# Patient Record
Sex: Male | Born: 1940 | Race: White | Hispanic: No | Smoking: Former smoker
Health system: Southern US, Community
[De-identification: ages and names within clinical notes are randomized; demographics above are authoritative.]

## PROBLEM LIST (undated history)

## (undated) DIAGNOSIS — M109 Gout, unspecified: Secondary | ICD-10-CM

## (undated) DIAGNOSIS — E785 Hyperlipidemia, unspecified: Secondary | ICD-10-CM

## (undated) DIAGNOSIS — I10 Essential (primary) hypertension: Secondary | ICD-10-CM

## (undated) DIAGNOSIS — N529 Male erectile dysfunction, unspecified: Secondary | ICD-10-CM

## (undated) HISTORY — PX: OTHER SURGICAL HISTORY: SHX169

## (undated) HISTORY — DX: Hyperlipidemia, unspecified: E78.5

## (undated) HISTORY — DX: Essential (primary) hypertension: I10

## (undated) HISTORY — DX: Gout, unspecified: M10.9

## (undated) HISTORY — DX: Male erectile dysfunction, unspecified: N52.9

---

## 1999-04-10 ENCOUNTER — Encounter: Payer: Self-pay | Admitting: Orthopedic Surgery

## 1999-04-10 ENCOUNTER — Ambulatory Visit (HOSPITAL_COMMUNITY): Admission: RE | Admit: 1999-04-10 | Discharge: 1999-04-10 | Payer: Self-pay | Admitting: Orthopedic Surgery

## 2003-12-08 ENCOUNTER — Ambulatory Visit (HOSPITAL_COMMUNITY): Admission: RE | Admit: 2003-12-08 | Discharge: 2003-12-08 | Payer: Self-pay | Admitting: Gastroenterology

## 2004-12-01 ENCOUNTER — Ambulatory Visit (HOSPITAL_BASED_OUTPATIENT_CLINIC_OR_DEPARTMENT_OTHER): Admission: RE | Admit: 2004-12-01 | Discharge: 2004-12-01 | Payer: Self-pay | Admitting: Urology

## 2004-12-01 ENCOUNTER — Ambulatory Visit (HOSPITAL_COMMUNITY): Admission: RE | Admit: 2004-12-01 | Discharge: 2004-12-01 | Payer: Self-pay | Admitting: Urology

## 2010-10-19 ENCOUNTER — Other Ambulatory Visit (HOSPITAL_COMMUNITY): Payer: Self-pay | Admitting: Ophthalmology

## 2010-10-19 ENCOUNTER — Encounter (HOSPITAL_COMMUNITY)
Admission: RE | Admit: 2010-10-19 | Discharge: 2010-10-19 | Disposition: A | Discharge status: Home / Self Care | Payer: Medicare Other | Source: Ambulatory Visit | Attending: Ophthalmology | Admitting: Ophthalmology

## 2010-10-19 ENCOUNTER — Ambulatory Visit (HOSPITAL_COMMUNITY)
Admission: RE | Admit: 2010-10-19 | Discharge: 2010-10-19 | Disposition: A | Discharge status: Home / Self Care | Payer: Medicare Other | Source: Ambulatory Visit | Attending: Ophthalmology | Admitting: Ophthalmology

## 2010-10-19 DIAGNOSIS — H11009 Unspecified pterygium of unspecified eye: Secondary | ICD-10-CM | POA: Insufficient documentation

## 2010-10-19 DIAGNOSIS — Z01818 Encounter for other preprocedural examination: Secondary | ICD-10-CM | POA: Insufficient documentation

## 2010-10-19 DIAGNOSIS — H11001 Unspecified pterygium of right eye: Secondary | ICD-10-CM

## 2010-10-19 DIAGNOSIS — Z01810 Encounter for preprocedural cardiovascular examination: Secondary | ICD-10-CM | POA: Insufficient documentation

## 2010-10-19 DIAGNOSIS — Z01812 Encounter for preprocedural laboratory examination: Secondary | ICD-10-CM | POA: Insufficient documentation

## 2010-10-19 LAB — SURGICAL PCR SCREEN
MRSA, PCR: NEGATIVE
Staphylococcus aureus: NEGATIVE

## 2010-10-19 LAB — CBC
HCT: 41.8 % (ref 39.0–52.0)
Hemoglobin: 14.2 g/dL (ref 13.0–17.0)
MCH: 30.1 pg (ref 26.0–34.0)
MCHC: 34 g/dL (ref 30.0–36.0)
MCV: 88.6 fL (ref 78.0–100.0)
Platelets: 197 10*3/uL (ref 150–400)
RBC: 4.72 MIL/uL (ref 4.22–5.81)
RDW: 13 % (ref 11.5–15.5)
WBC: 7.5 10*3/uL (ref 4.0–10.5)

## 2010-10-19 LAB — BASIC METABOLIC PANEL
BUN: 24 mg/dL — ABNORMAL HIGH (ref 6–23)
CO2: 28 mEq/L (ref 19–32)
Calcium: 10.3 mg/dL (ref 8.4–10.5)
Chloride: 100 mEq/L (ref 96–112)
Creatinine, Ser: 0.89 mg/dL (ref 0.50–1.35)
GFR calc Af Amer: >60 mL/min (ref >60)
GFR calc non Af Amer: >60 mL/min (ref >60)
Glucose, Bld: 144 mg/dL — ABNORMAL HIGH (ref 70–99)
Potassium: 5.4 mEq/L — ABNORMAL HIGH (ref 3.5–5.1)
Sodium: 137 mEq/L (ref 135–145)

## 2010-10-31 ENCOUNTER — Ambulatory Visit (HOSPITAL_COMMUNITY)
Admission: RE | Admit: 2010-10-31 | Discharge: 2010-10-31 | Disposition: A | Discharge status: Home / Self Care | Payer: Medicare Other | Source: Ambulatory Visit | Attending: Ophthalmology | Admitting: Ophthalmology

## 2010-10-31 DIAGNOSIS — H11009 Unspecified pterygium of unspecified eye: Secondary | ICD-10-CM | POA: Insufficient documentation

## 2010-10-31 DIAGNOSIS — H179 Unspecified corneal scar and opacity: Secondary | ICD-10-CM | POA: Insufficient documentation

## 2010-10-31 DIAGNOSIS — J4489 Other specified chronic obstructive pulmonary disease: Secondary | ICD-10-CM | POA: Insufficient documentation

## 2010-10-31 DIAGNOSIS — K219 Gastro-esophageal reflux disease without esophagitis: Secondary | ICD-10-CM | POA: Insufficient documentation

## 2010-10-31 DIAGNOSIS — I10 Essential (primary) hypertension: Secondary | ICD-10-CM | POA: Insufficient documentation

## 2010-10-31 DIAGNOSIS — J449 Chronic obstructive pulmonary disease, unspecified: Secondary | ICD-10-CM | POA: Insufficient documentation

## 2010-10-31 DIAGNOSIS — Z01812 Encounter for preprocedural laboratory examination: Secondary | ICD-10-CM | POA: Insufficient documentation

## 2010-10-31 DIAGNOSIS — Z01810 Encounter for preprocedural cardiovascular examination: Secondary | ICD-10-CM | POA: Insufficient documentation

## 2010-10-31 DIAGNOSIS — Z01818 Encounter for other preprocedural examination: Secondary | ICD-10-CM | POA: Insufficient documentation

## 2013-05-05 ENCOUNTER — Other Ambulatory Visit: Payer: Self-pay | Admitting: Family Medicine

## 2013-05-05 ENCOUNTER — Ambulatory Visit
Admission: RE | Admit: 2013-05-05 | Discharge: 2013-05-05 | Disposition: A | Discharge status: Home / Self Care | Payer: Medicare Other | Source: Ambulatory Visit | Attending: Family Medicine | Admitting: Family Medicine

## 2013-05-05 DIAGNOSIS — R042 Hemoptysis: Secondary | ICD-10-CM

## 2013-05-05 DIAGNOSIS — R059 Cough, unspecified: Secondary | ICD-10-CM

## 2013-05-05 DIAGNOSIS — R05 Cough: Secondary | ICD-10-CM

## 2013-05-12 IMAGING — CR DG CHEST 2V
2 series · 2 of 2 positions shown · non-contrast
Comparison: None

CLINICAL DATA: Preop, surgeryeye surgery

CHEST - 2 VIEW

[view not recorded (1 of 2)]
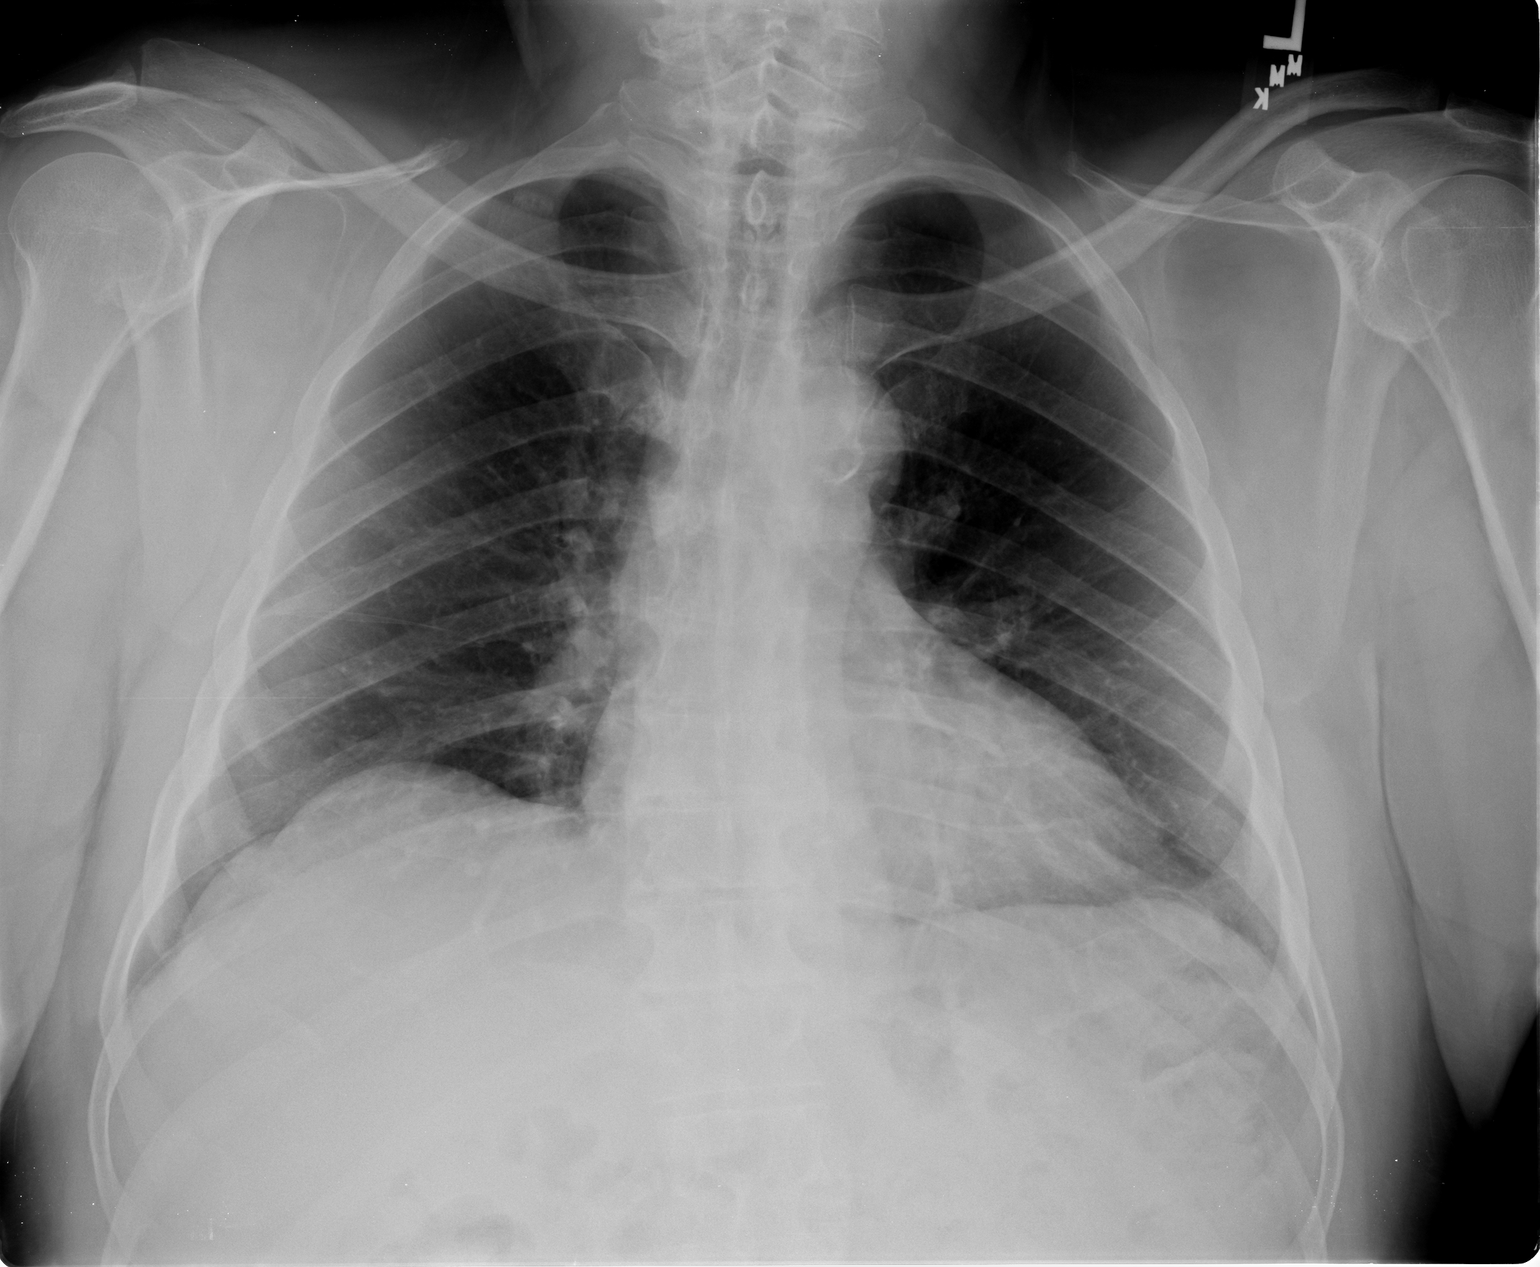

[view not recorded (2 of 2)]
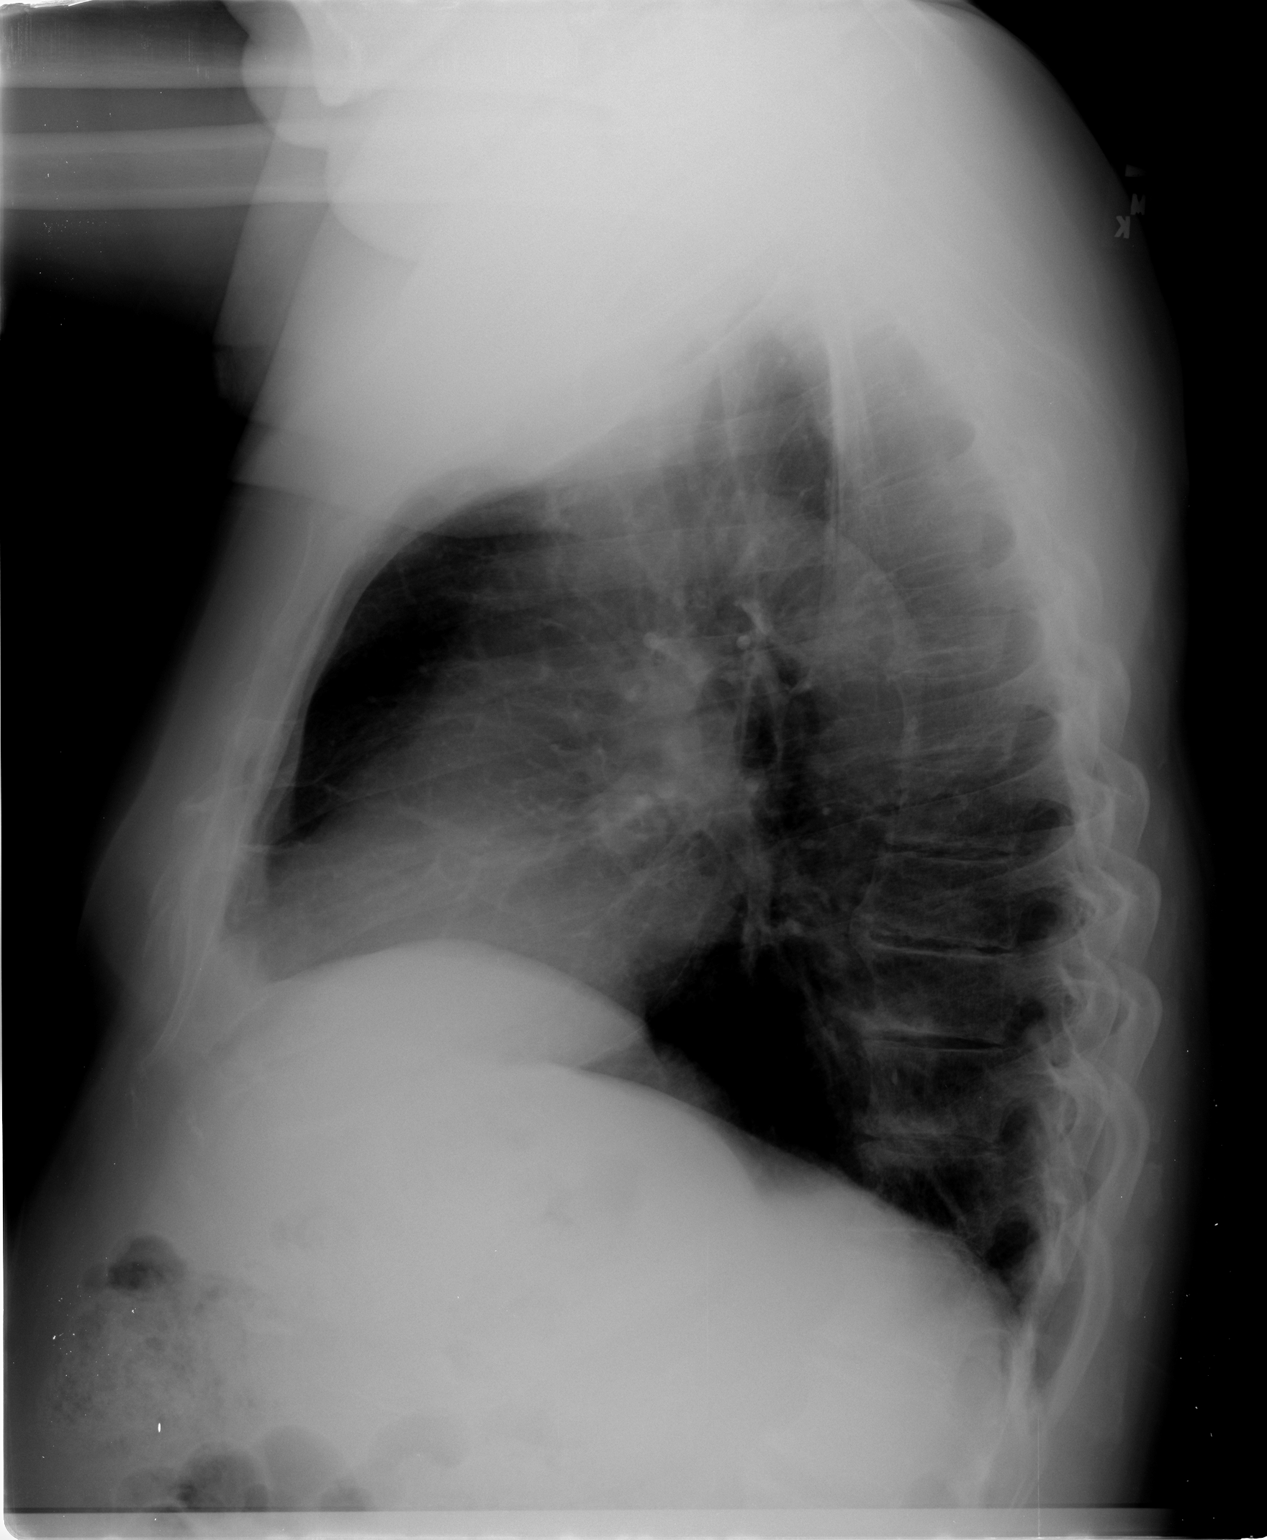

[2 of 2 positions shown; findings below may reference images not displayed]

FINDINGS: Normal mediastinum and heart silhouette.  Costophrenic
angles are clear.  No evidence effusion, infiltrate, or
pneumothorax.
IMPRESSION: No acute cardiopulmonary process.

## 2013-12-30 ENCOUNTER — Other Ambulatory Visit: Payer: Self-pay | Admitting: Gastroenterology

## 2014-05-07 ENCOUNTER — Encounter (HOSPITAL_COMMUNITY): Payer: Self-pay | Admitting: *Deleted

## 2014-05-18 ENCOUNTER — Ambulatory Visit (HOSPITAL_COMMUNITY)
Admission: RE | Admit: 2014-05-18 | Discharge: 2014-05-18 | Disposition: A | Discharge status: Home / Self Care | Payer: Medicare Other | Source: Ambulatory Visit | Attending: Gastroenterology | Admitting: Gastroenterology

## 2014-05-18 ENCOUNTER — Ambulatory Visit (HOSPITAL_COMMUNITY): Payer: Medicare Other | Admitting: Anesthesiology

## 2014-05-18 ENCOUNTER — Encounter (HOSPITAL_COMMUNITY): Payer: Self-pay | Admitting: *Deleted

## 2014-05-18 ENCOUNTER — Encounter (HOSPITAL_COMMUNITY)
Admission: RE | Disposition: A | Discharge status: Home / Self Care | Payer: Self-pay | Source: Ambulatory Visit | Attending: Gastroenterology

## 2014-05-18 DIAGNOSIS — M109 Gout, unspecified: Secondary | ICD-10-CM | POA: Insufficient documentation

## 2014-05-18 DIAGNOSIS — I10 Essential (primary) hypertension: Secondary | ICD-10-CM | POA: Insufficient documentation

## 2014-05-18 DIAGNOSIS — Z1211 Encounter for screening for malignant neoplasm of colon: Secondary | ICD-10-CM | POA: Insufficient documentation

## 2014-05-18 DIAGNOSIS — E780 Pure hypercholesterolemia: Secondary | ICD-10-CM | POA: Insufficient documentation

## 2014-05-18 DIAGNOSIS — E119 Type 2 diabetes mellitus without complications: Secondary | ICD-10-CM | POA: Insufficient documentation

## 2014-05-18 DIAGNOSIS — K5730 Diverticulosis of large intestine without perforation or abscess without bleeding: Secondary | ICD-10-CM | POA: Diagnosis not present

## 2014-05-18 HISTORY — PX: COLONOSCOPY WITH PROPOFOL: SHX5780

## 2014-05-18 SURGERY — COLONOSCOPY WITH PROPOFOL
Anesthesia: Monitor Anesthesia Care

## 2014-05-18 MED ORDER — LACTATED RINGERS IV SOLN
INTRAVENOUS | Status: DC
  Administered 2014-05-18: 10:00:00 via INTRAVENOUS

## 2014-05-18 MED ORDER — PROPOFOL 10 MG/ML IV BOLUS
INTRAVENOUS | Status: AC
Start: 2014-05-18 — End: 2014-05-18
  Filled 2014-05-18: qty 20

## 2014-05-18 MED ORDER — PROPOFOL 10 MG/ML IV BOLUS
INTRAVENOUS | Status: AC
  Filled 2014-05-18: qty 20

## 2014-05-18 MED ORDER — LIDOCAINE HCL (CARDIAC) 20 MG/ML IV SOLN
INTRAVENOUS | Status: AC
Start: 2014-05-18 — End: 2014-05-18
  Filled 2014-05-18: qty 5

## 2014-05-18 MED ORDER — SODIUM CHLORIDE 0.9 % IV SOLN: INTRAVENOUS | Status: DC

## 2014-05-18 MED ORDER — PROPOFOL INFUSION 10 MG/ML OPTIME
INTRAVENOUS | Status: DC | PRN
  Administered 2014-05-18: 160 ug/kg/min via INTRAVENOUS

## 2014-05-18 SURGICAL SUPPLY — 22 items

## 2014-05-18 NOTE — Anesthesia Postprocedure Evaluation (Signed)
  Anesthesia Post-op Note  Patient: Trevor Ortiz  Procedure(s) Performed: Procedure(s): COLONOSCOPY WITH PROPOFOL (N/A)  Patient Location: PACU  Anesthesia Type:MAC  Level of Consciousness: awake and alert   Airway and Oxygen Therapy: Patient Spontanous Breathing  Post-op Pain: none  Post-op Assessment: Post-op Vital signs reviewed  Post-op Vital Signs: Reviewed and stable  Last Vitals:  Filed Vitals:   05/18/14 1200  BP: 162/70  Pulse: 53  Temp:   Resp: 12    Complications: No apparent anesthesia complications

## 2014-05-18 NOTE — H&P (Signed)
  Procedure: Screening colonoscopy. Normal screening colonoscopy performed on 12/08/2003  History: The patient is a 74 year old male born 11-10-1940. He is scheduled to undergo a screening colonoscopy with polypectomy to prevent colon cancer today.  Medication allergies: Novocaine  Past medical history: Left cataract surgery. Testicular cyst surgery. Right hand surgery. Type 2 diabetes mellitus. Gout. Hypercholesterolemia. Hypertension.  Exam: The patient is alert and lying comfortably on the endoscopy stretcher. Abdomen is soft and nontender to palpation. Lungs are clear to auscultation. Cardiac exam reveals a regular rhythm.  Plan: Proceed with screening colonoscopy

## 2014-05-18 NOTE — Transfer of Care (Signed)
Immediate Anesthesia Transfer of Care Note  Patient: Trevor Ortiz  Procedure(s) Performed: Procedure(s): COLONOSCOPY WITH PROPOFOL (N/A)  Patient Location: PACU and Endoscopy Unit  Anesthesia Type:MAC  Level of Consciousness: awake, alert , oriented and patient cooperative  Airway & Oxygen Therapy: Patient Spontanous Breathing and Patient connected to face mask oxygen  Post-op Assessment: Report given to RN, Post -op Vital signs reviewed and stable and Patient moving all extremities  Post vital signs: Reviewed and stable  Last Vitals:  Filed Vitals:   05/18/14 0926  BP: 170/64  Pulse: 61  Temp: 36.9 C  Resp: 10    Complications: No apparent anesthesia complications

## 2014-05-18 NOTE — Discharge Instructions (Signed)
Colonoscopy, Care After °Refer to this sheet in the next few weeks. These instructions provide you with information on caring for yourself after your procedure. Your health care provider may also give you more specific instructions. Your treatment has been planned according to current medical practices, but problems sometimes occur. Call your health care provider if you have any problems or questions after your procedure. °WHAT TO EXPECT AFTER THE PROCEDURE  °After your procedure, it is typical to have the following: °· A small amount of blood in your stool. °· Moderate amounts of gas and mild abdominal cramping or bloating. ° ° °HOME CARE INSTRUCTIONS °· Do not drive, operate machinery, or sign important documents for 24 hours. °· You may shower and resume your regular physical activities, but move at a slower pace for the first 24 hours. °· Take frequent rest periods for the first 24 hours. °· Walk around or put a warm pack on your abdomen to help reduce abdominal cramping and bloating. °· Drink enough fluids to keep your urine clear or pale yellow. °· You may resume your normal diet as instructed by your health care provider. Avoid heavy or fried foods that are hard to digest. °· Avoid drinking alcohol for 24 hours or as instructed by your health care provider. °· Only take over-the-counter or prescription medicines as directed by your health care provider. °· If a tissue sample (biopsy) was taken during your procedure: °¨ Do not take aspirin or blood thinners for 7 days, or as instructed by your health care provider. °¨ Do not drink alcohol for 7 days, or as instructed by your health care provider. °¨ Eat soft foods for the first 24 hours. ° ° °SEEK MEDICAL CARE IF: °You have persistent spotting of blood in your stool 2-3 days after the procedure. ° ° °SEEK IMMEDIATE MEDICAL CARE IF: °· You have more than a small spotting of blood in your stool. °· You pass large blood clots in your stool. °· Your abdomen is  swollen (distended). °· You have nausea or vomiting. °· You have a fever. °· You have increasing abdominal pain that is not relieved with medicine. ° ° °

## 2014-05-18 NOTE — Anesthesia Preprocedure Evaluation (Addendum)
Anesthesia Evaluation  Patient identified by MRN, date of birth, ID band Patient awake    Reviewed: Allergy & Precautions, NPO status , Patient's Chart, lab work & pertinent test results  Airway Mallampati: I  TM Distance: >3 FB Neck ROM: Full    Dental  (+) Teeth Intact   Pulmonary former smoker,  breath sounds clear to auscultation        Cardiovascular hypertension, Pt. on medications Rhythm:Regular Rate:Normal     Neuro/Psych negative neurological ROS     GI/Hepatic Neg liver ROS, GERD-  Medicated,  Endo/Other  negative endocrine ROS  Renal/GU negative Renal ROS     Musculoskeletal negative musculoskeletal ROS (+)   Abdominal   Peds  Hematology negative hematology ROS (+)   Anesthesia Other Findings   Reproductive/Obstetrics                           Anesthesia Physical Anesthesia Plan  ASA: II  Anesthesia Plan: MAC   Post-op Pain Management:    Induction: Intravenous  Airway Management Planned: Simple Face Mask and Natural Airway  Additional Equipment:   Intra-op Plan:   Post-operative Plan:   Informed Consent: I have reviewed the patients History and Physical, chart, labs and discussed the procedure including the risks, benefits and alternatives for the proposed anesthesia with the patient or authorized representative who has indicated his/her understanding and acceptance.     Plan Discussed with: CRNA  Anesthesia Plan Comments:         Anesthesia Quick Evaluation

## 2014-05-18 NOTE — Op Note (Signed)
Procedure: Screening colonoscopy. Normal screening colonoscopy performed on 12/08/2003  Endoscopist: Earle Gell  Premedication: Propofol administered by anesthesia  Procedure: The patient was placed in the left lateral decubitus position. Anal inspection and digital rectal exam were normal. The Pentax pediatric colonoscope was introduced into the rectum and advanced to the cecum. A normal-appearing appendiceal orifice was identified. A normal-appearing ileocecal valve was identified. Colonic preparation for the exam today was good. Withdrawal time was 10 minutes  Rectum. Normal. Retroflexed view of the distal rectum normal  Sigmoid colon and descending colon. Left colonic diverticulosis  Splenic flexure. Normal  Transverse colon. Normal  Hepatic flexure. Normal  Ascending colon. Normal  Cecum and ileocecal valve. Normal  Assessment: Normal screening colonoscopy

## 2014-05-19 ENCOUNTER — Encounter (HOSPITAL_COMMUNITY): Payer: Self-pay | Admitting: Gastroenterology

## 2015-06-15 DIAGNOSIS — C44319 Basal cell carcinoma of skin of other parts of face: Secondary | ICD-10-CM | POA: Diagnosis not present

## 2015-06-15 DIAGNOSIS — Z1283 Encounter for screening for malignant neoplasm of skin: Secondary | ICD-10-CM | POA: Diagnosis not present

## 2015-06-15 DIAGNOSIS — D225 Melanocytic nevi of trunk: Secondary | ICD-10-CM | POA: Diagnosis not present

## 2015-06-28 DIAGNOSIS — T148 Other injury of unspecified body region: Secondary | ICD-10-CM | POA: Diagnosis not present

## 2015-07-15 DIAGNOSIS — L570 Actinic keratosis: Secondary | ICD-10-CM | POA: Diagnosis not present

## 2015-07-15 DIAGNOSIS — X32XXXD Exposure to sunlight, subsequent encounter: Secondary | ICD-10-CM | POA: Diagnosis not present

## 2015-08-05 DIAGNOSIS — X32XXXD Exposure to sunlight, subsequent encounter: Secondary | ICD-10-CM | POA: Diagnosis not present

## 2015-08-05 DIAGNOSIS — C44319 Basal cell carcinoma of skin of other parts of face: Secondary | ICD-10-CM | POA: Diagnosis not present

## 2015-08-05 DIAGNOSIS — L570 Actinic keratosis: Secondary | ICD-10-CM | POA: Diagnosis not present

## 2015-08-11 DIAGNOSIS — J309 Allergic rhinitis, unspecified: Secondary | ICD-10-CM | POA: Diagnosis not present

## 2015-08-11 DIAGNOSIS — Z0000 Encounter for general adult medical examination without abnormal findings: Secondary | ICD-10-CM | POA: Diagnosis not present

## 2015-08-11 DIAGNOSIS — Z125 Encounter for screening for malignant neoplasm of prostate: Secondary | ICD-10-CM | POA: Diagnosis not present

## 2015-08-11 DIAGNOSIS — M150 Primary generalized (osteo)arthritis: Secondary | ICD-10-CM | POA: Diagnosis not present

## 2015-08-11 DIAGNOSIS — M109 Gout, unspecified: Secondary | ICD-10-CM | POA: Diagnosis not present

## 2015-08-11 DIAGNOSIS — E782 Mixed hyperlipidemia: Secondary | ICD-10-CM | POA: Diagnosis not present

## 2015-08-11 DIAGNOSIS — N400 Enlarged prostate without lower urinary tract symptoms: Secondary | ICD-10-CM | POA: Diagnosis not present

## 2015-08-11 DIAGNOSIS — E119 Type 2 diabetes mellitus without complications: Secondary | ICD-10-CM | POA: Diagnosis not present

## 2015-08-11 DIAGNOSIS — I10 Essential (primary) hypertension: Secondary | ICD-10-CM | POA: Diagnosis not present

## 2015-08-11 DIAGNOSIS — K219 Gastro-esophageal reflux disease without esophagitis: Secondary | ICD-10-CM | POA: Diagnosis not present

## 2015-08-11 DIAGNOSIS — N528 Other male erectile dysfunction: Secondary | ICD-10-CM | POA: Diagnosis not present

## 2015-09-06 DIAGNOSIS — H11002 Unspecified pterygium of left eye: Secondary | ICD-10-CM | POA: Diagnosis not present

## 2015-09-28 DIAGNOSIS — S91332A Puncture wound without foreign body, left foot, initial encounter: Secondary | ICD-10-CM | POA: Diagnosis not present

## 2015-11-27 IMAGING — CR DG CHEST 2V
2 series · 2 of 2 positions shown · non-contrast
Comparison: 10/19/2010

CLINICAL DATA: Cough, hemoptysis

EXAM:
CHEST  2 VIEW

[w chest pa]
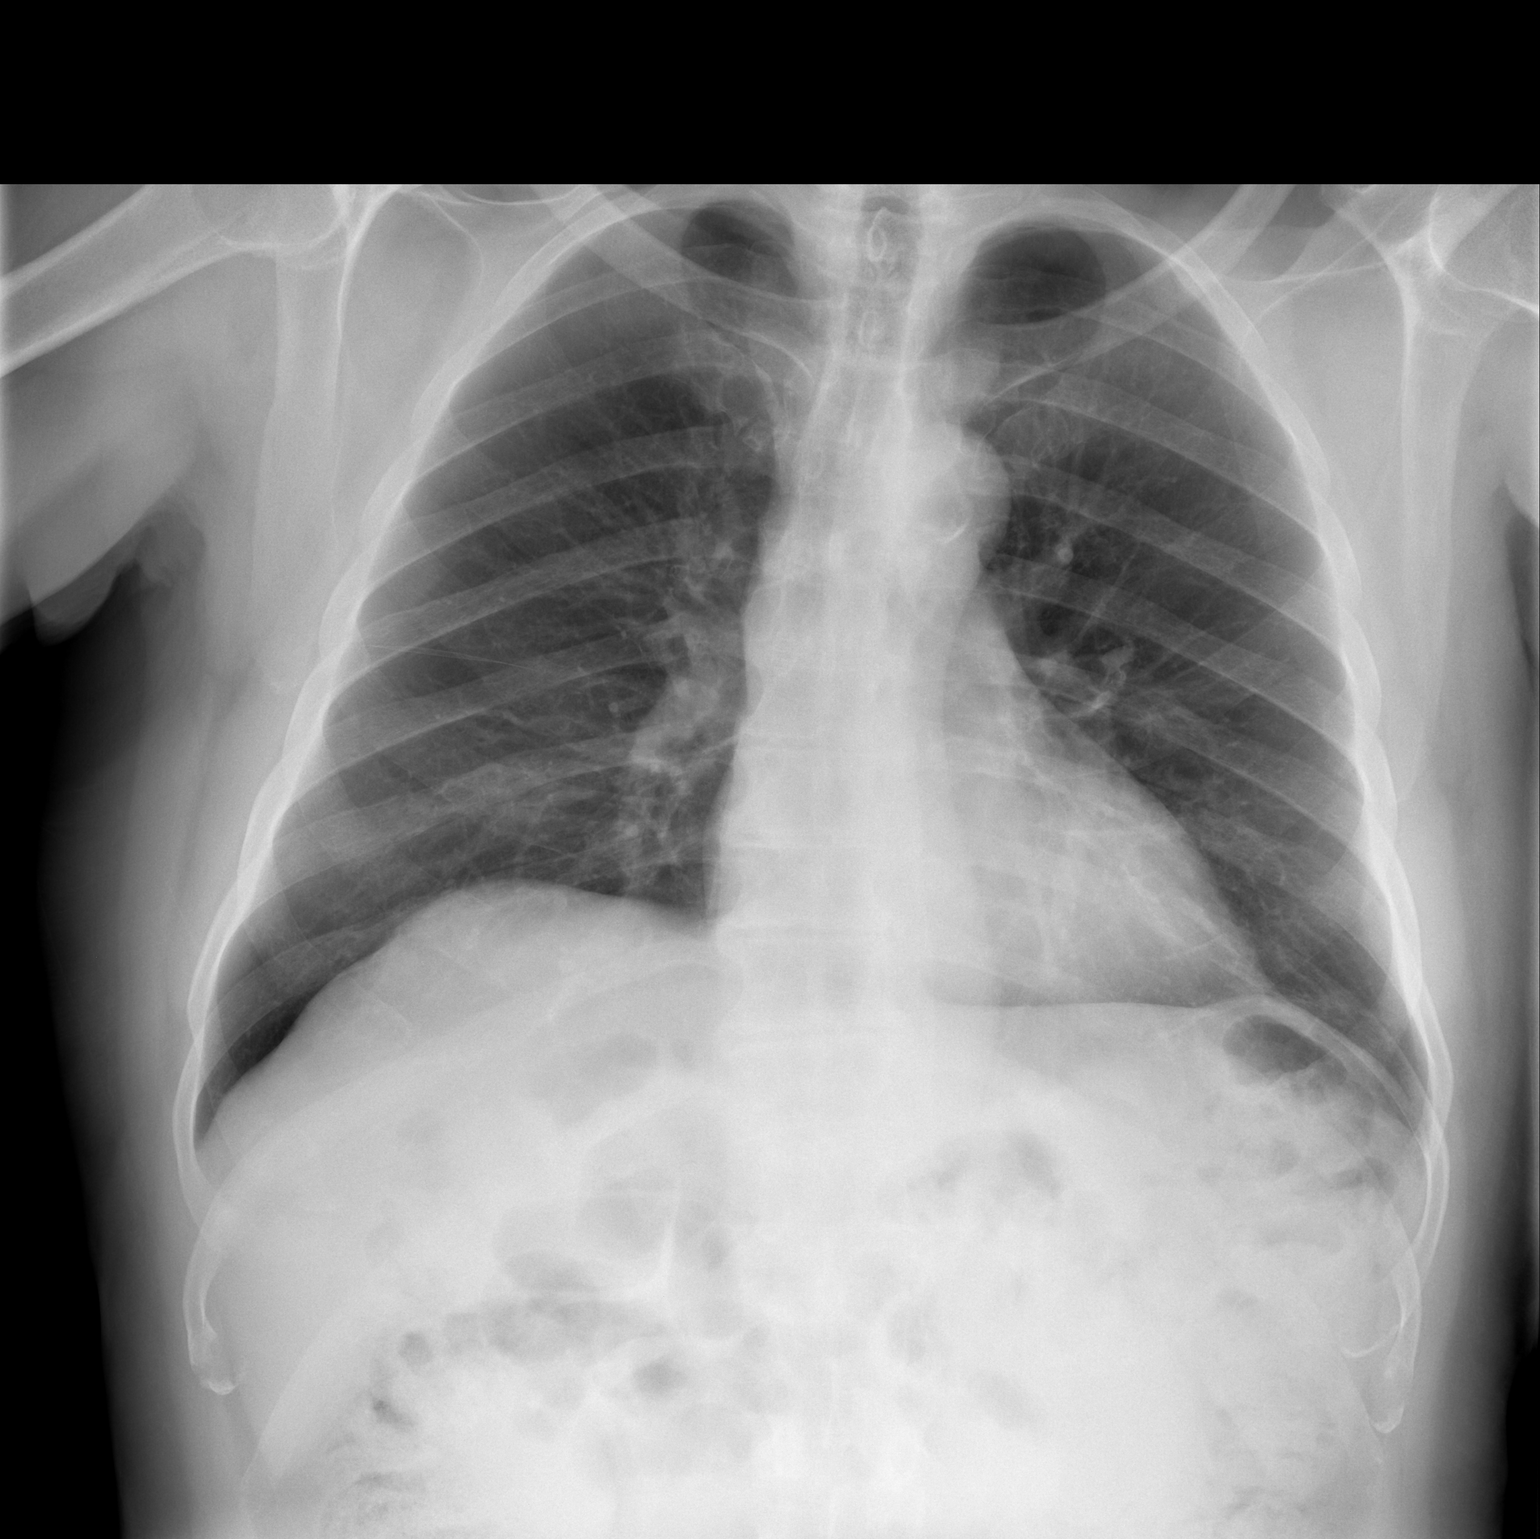

[w chest lat]
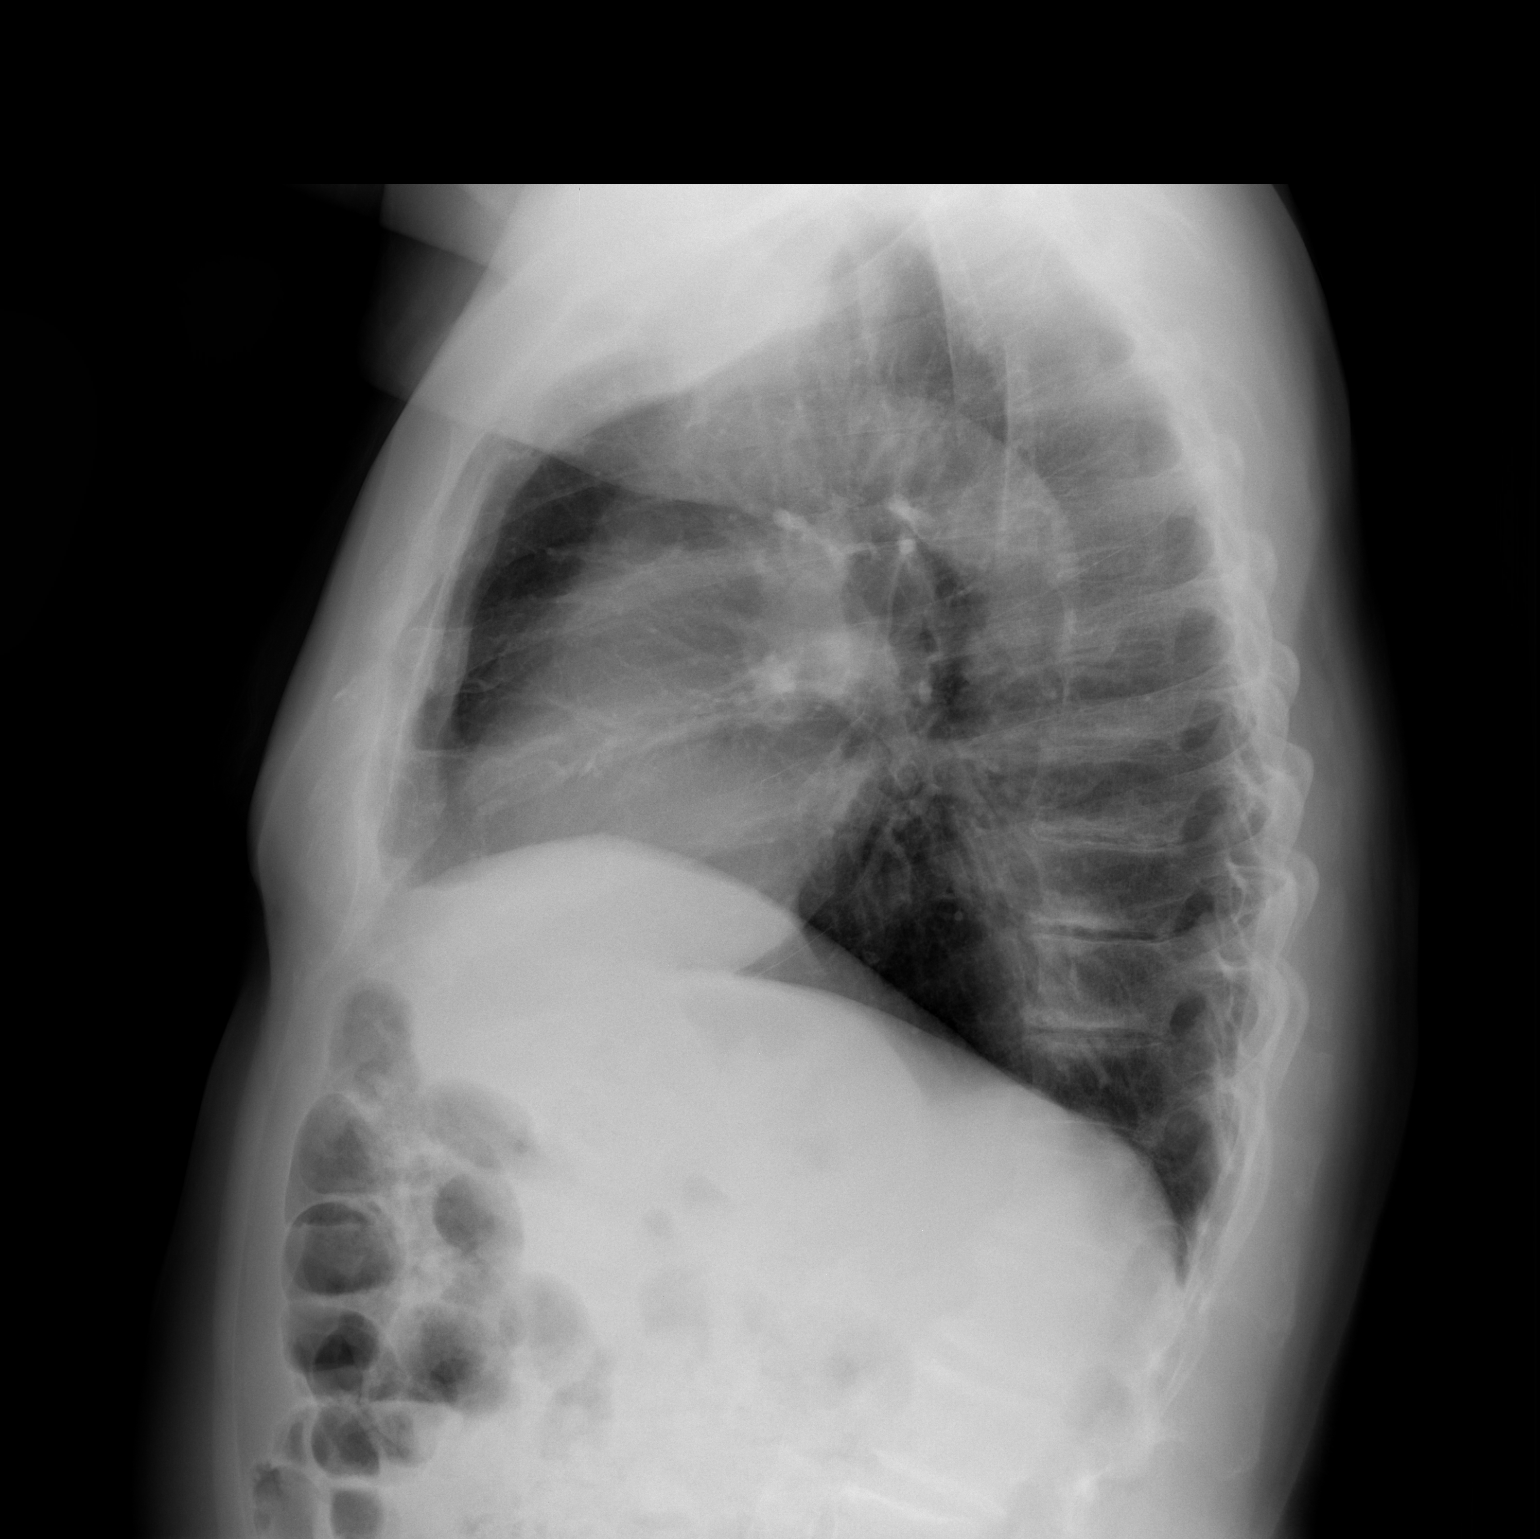

[2 of 2 positions shown; findings below may reference images not displayed]

FINDINGS: Cardiomediastinal silhouette is stable. No acute infiltrate or
pleural effusion. No pulmonary edema. Mild degenerative changes
thoracic spine.
IMPRESSION: No active disease.  Mild degenerative changes thoracic spine.

## 2016-01-06 DIAGNOSIS — Z23 Encounter for immunization: Secondary | ICD-10-CM | POA: Diagnosis not present

## 2016-02-28 DIAGNOSIS — E782 Mixed hyperlipidemia: Secondary | ICD-10-CM | POA: Diagnosis not present

## 2016-02-28 DIAGNOSIS — E119 Type 2 diabetes mellitus without complications: Secondary | ICD-10-CM | POA: Diagnosis not present

## 2016-02-28 DIAGNOSIS — I10 Essential (primary) hypertension: Secondary | ICD-10-CM | POA: Diagnosis not present

## 2016-07-13 DIAGNOSIS — Z1283 Encounter for screening for malignant neoplasm of skin: Secondary | ICD-10-CM | POA: Diagnosis not present

## 2016-07-13 DIAGNOSIS — L570 Actinic keratosis: Secondary | ICD-10-CM | POA: Diagnosis not present

## 2016-07-13 DIAGNOSIS — X32XXXD Exposure to sunlight, subsequent encounter: Secondary | ICD-10-CM | POA: Diagnosis not present

## 2016-10-26 DIAGNOSIS — L821 Other seborrheic keratosis: Secondary | ICD-10-CM | POA: Diagnosis not present

## 2016-10-26 DIAGNOSIS — L820 Inflamed seborrheic keratosis: Secondary | ICD-10-CM | POA: Diagnosis not present

## 2016-10-26 DIAGNOSIS — D225 Melanocytic nevi of trunk: Secondary | ICD-10-CM | POA: Diagnosis not present

## 2016-10-26 DIAGNOSIS — X32XXXD Exposure to sunlight, subsequent encounter: Secondary | ICD-10-CM | POA: Diagnosis not present

## 2016-10-26 DIAGNOSIS — L570 Actinic keratosis: Secondary | ICD-10-CM | POA: Diagnosis not present

## 2016-10-31 DIAGNOSIS — E1165 Type 2 diabetes mellitus with hyperglycemia: Secondary | ICD-10-CM | POA: Diagnosis not present

## 2016-10-31 DIAGNOSIS — J309 Allergic rhinitis, unspecified: Secondary | ICD-10-CM | POA: Diagnosis not present

## 2016-10-31 DIAGNOSIS — E782 Mixed hyperlipidemia: Secondary | ICD-10-CM | POA: Diagnosis not present

## 2016-10-31 DIAGNOSIS — Z23 Encounter for immunization: Secondary | ICD-10-CM | POA: Diagnosis not present

## 2016-10-31 DIAGNOSIS — I10 Essential (primary) hypertension: Secondary | ICD-10-CM | POA: Diagnosis not present

## 2016-11-14 DIAGNOSIS — H11002 Unspecified pterygium of left eye: Secondary | ICD-10-CM | POA: Diagnosis not present

## 2017-03-05 DIAGNOSIS — X32XXXD Exposure to sunlight, subsequent encounter: Secondary | ICD-10-CM | POA: Diagnosis not present

## 2017-03-05 DIAGNOSIS — L570 Actinic keratosis: Secondary | ICD-10-CM | POA: Diagnosis not present

## 2017-05-16 DIAGNOSIS — H11002 Unspecified pterygium of left eye: Secondary | ICD-10-CM | POA: Diagnosis not present

## 2017-05-27 DIAGNOSIS — M150 Primary generalized (osteo)arthritis: Secondary | ICD-10-CM | POA: Diagnosis not present

## 2017-05-27 DIAGNOSIS — K219 Gastro-esophageal reflux disease without esophagitis: Secondary | ICD-10-CM | POA: Diagnosis not present

## 2017-05-27 DIAGNOSIS — N528 Other male erectile dysfunction: Secondary | ICD-10-CM | POA: Diagnosis not present

## 2017-05-27 DIAGNOSIS — Z0000 Encounter for general adult medical examination without abnormal findings: Secondary | ICD-10-CM | POA: Diagnosis not present

## 2017-05-27 DIAGNOSIS — E1169 Type 2 diabetes mellitus with other specified complication: Secondary | ICD-10-CM | POA: Diagnosis not present

## 2017-05-27 DIAGNOSIS — I10 Essential (primary) hypertension: Secondary | ICD-10-CM | POA: Diagnosis not present

## 2017-05-27 DIAGNOSIS — M109 Gout, unspecified: Secondary | ICD-10-CM | POA: Diagnosis not present

## 2017-05-27 DIAGNOSIS — N400 Enlarged prostate without lower urinary tract symptoms: Secondary | ICD-10-CM | POA: Diagnosis not present

## 2017-05-27 DIAGNOSIS — J309 Allergic rhinitis, unspecified: Secondary | ICD-10-CM | POA: Diagnosis not present

## 2017-05-27 DIAGNOSIS — E782 Mixed hyperlipidemia: Secondary | ICD-10-CM | POA: Diagnosis not present

## 2017-05-27 DIAGNOSIS — Z125 Encounter for screening for malignant neoplasm of prostate: Secondary | ICD-10-CM | POA: Diagnosis not present

## 2017-06-14 DIAGNOSIS — L570 Actinic keratosis: Secondary | ICD-10-CM | POA: Diagnosis not present

## 2017-06-14 DIAGNOSIS — X32XXXD Exposure to sunlight, subsequent encounter: Secondary | ICD-10-CM | POA: Diagnosis not present

## 2017-11-27 DIAGNOSIS — Z01818 Encounter for other preprocedural examination: Secondary | ICD-10-CM | POA: Diagnosis not present

## 2017-11-27 DIAGNOSIS — H11002 Unspecified pterygium of left eye: Secondary | ICD-10-CM | POA: Diagnosis not present

## 2017-11-27 DIAGNOSIS — H16002 Unspecified corneal ulcer, left eye: Secondary | ICD-10-CM | POA: Diagnosis not present

## 2017-11-29 DIAGNOSIS — E782 Mixed hyperlipidemia: Secondary | ICD-10-CM | POA: Diagnosis not present

## 2017-11-29 DIAGNOSIS — E1169 Type 2 diabetes mellitus with other specified complication: Secondary | ICD-10-CM | POA: Diagnosis not present

## 2017-11-29 DIAGNOSIS — S81811A Laceration without foreign body, right lower leg, initial encounter: Secondary | ICD-10-CM | POA: Diagnosis not present

## 2017-11-29 DIAGNOSIS — Z23 Encounter for immunization: Secondary | ICD-10-CM | POA: Diagnosis not present

## 2017-11-29 DIAGNOSIS — I10 Essential (primary) hypertension: Secondary | ICD-10-CM | POA: Diagnosis not present

## 2017-12-09 DIAGNOSIS — H11002 Unspecified pterygium of left eye: Secondary | ICD-10-CM | POA: Diagnosis not present

## 2017-12-31 DIAGNOSIS — L570 Actinic keratosis: Secondary | ICD-10-CM | POA: Diagnosis not present

## 2017-12-31 DIAGNOSIS — D225 Melanocytic nevi of trunk: Secondary | ICD-10-CM | POA: Diagnosis not present

## 2017-12-31 DIAGNOSIS — Z1283 Encounter for screening for malignant neoplasm of skin: Secondary | ICD-10-CM | POA: Diagnosis not present

## 2017-12-31 DIAGNOSIS — D040 Carcinoma in situ of skin of lip: Secondary | ICD-10-CM | POA: Diagnosis not present

## 2017-12-31 DIAGNOSIS — X32XXXD Exposure to sunlight, subsequent encounter: Secondary | ICD-10-CM | POA: Diagnosis not present

## 2018-01-28 DIAGNOSIS — L570 Actinic keratosis: Secondary | ICD-10-CM | POA: Diagnosis not present

## 2018-01-28 DIAGNOSIS — X32XXXD Exposure to sunlight, subsequent encounter: Secondary | ICD-10-CM | POA: Diagnosis not present

## 2018-03-04 DIAGNOSIS — X32XXXD Exposure to sunlight, subsequent encounter: Secondary | ICD-10-CM | POA: Diagnosis not present

## 2018-03-04 DIAGNOSIS — D040 Carcinoma in situ of skin of lip: Secondary | ICD-10-CM | POA: Diagnosis not present

## 2018-03-04 DIAGNOSIS — L570 Actinic keratosis: Secondary | ICD-10-CM | POA: Diagnosis not present

## 2018-04-16 DIAGNOSIS — D040 Carcinoma in situ of skin of lip: Secondary | ICD-10-CM | POA: Diagnosis not present

## 2018-04-16 DIAGNOSIS — L820 Inflamed seborrheic keratosis: Secondary | ICD-10-CM | POA: Diagnosis not present

## 2018-06-17 DIAGNOSIS — M150 Primary generalized (osteo)arthritis: Secondary | ICD-10-CM | POA: Diagnosis not present

## 2018-06-17 DIAGNOSIS — G4700 Insomnia, unspecified: Secondary | ICD-10-CM | POA: Diagnosis not present

## 2018-06-17 DIAGNOSIS — E1169 Type 2 diabetes mellitus with other specified complication: Secondary | ICD-10-CM | POA: Diagnosis not present

## 2018-06-17 DIAGNOSIS — Z0000 Encounter for general adult medical examination without abnormal findings: Secondary | ICD-10-CM | POA: Diagnosis not present

## 2018-06-17 DIAGNOSIS — I10 Essential (primary) hypertension: Secondary | ICD-10-CM | POA: Diagnosis not present

## 2018-06-17 DIAGNOSIS — E782 Mixed hyperlipidemia: Secondary | ICD-10-CM | POA: Diagnosis not present

## 2018-06-17 DIAGNOSIS — K219 Gastro-esophageal reflux disease without esophagitis: Secondary | ICD-10-CM | POA: Diagnosis not present

## 2018-06-17 DIAGNOSIS — Z125 Encounter for screening for malignant neoplasm of prostate: Secondary | ICD-10-CM | POA: Diagnosis not present

## 2018-06-17 DIAGNOSIS — J309 Allergic rhinitis, unspecified: Secondary | ICD-10-CM | POA: Diagnosis not present

## 2018-06-17 DIAGNOSIS — M109 Gout, unspecified: Secondary | ICD-10-CM | POA: Diagnosis not present

## 2018-06-17 DIAGNOSIS — N528 Other male erectile dysfunction: Secondary | ICD-10-CM | POA: Diagnosis not present

## 2018-06-17 DIAGNOSIS — N400 Enlarged prostate without lower urinary tract symptoms: Secondary | ICD-10-CM | POA: Diagnosis not present

## 2018-07-30 DIAGNOSIS — X32XXXD Exposure to sunlight, subsequent encounter: Secondary | ICD-10-CM | POA: Diagnosis not present

## 2018-07-30 DIAGNOSIS — L570 Actinic keratosis: Secondary | ICD-10-CM | POA: Diagnosis not present

## 2018-11-22 DIAGNOSIS — Z23 Encounter for immunization: Secondary | ICD-10-CM | POA: Diagnosis not present

## 2018-12-17 DIAGNOSIS — E1169 Type 2 diabetes mellitus with other specified complication: Secondary | ICD-10-CM | POA: Diagnosis not present

## 2018-12-17 DIAGNOSIS — I10 Essential (primary) hypertension: Secondary | ICD-10-CM | POA: Diagnosis not present

## 2018-12-17 DIAGNOSIS — E782 Mixed hyperlipidemia: Secondary | ICD-10-CM | POA: Diagnosis not present

## 2018-12-19 DIAGNOSIS — Z7189 Other specified counseling: Secondary | ICD-10-CM | POA: Diagnosis not present

## 2018-12-19 DIAGNOSIS — I10 Essential (primary) hypertension: Secondary | ICD-10-CM | POA: Diagnosis not present

## 2018-12-19 DIAGNOSIS — E1169 Type 2 diabetes mellitus with other specified complication: Secondary | ICD-10-CM | POA: Diagnosis not present

## 2018-12-19 DIAGNOSIS — E782 Mixed hyperlipidemia: Secondary | ICD-10-CM | POA: Diagnosis not present

## 2018-12-19 DIAGNOSIS — G4700 Insomnia, unspecified: Secondary | ICD-10-CM | POA: Diagnosis not present

## 2019-01-27 DIAGNOSIS — L570 Actinic keratosis: Secondary | ICD-10-CM | POA: Diagnosis not present

## 2019-01-27 DIAGNOSIS — L821 Other seborrheic keratosis: Secondary | ICD-10-CM | POA: Diagnosis not present

## 2019-01-27 DIAGNOSIS — X32XXXD Exposure to sunlight, subsequent encounter: Secondary | ICD-10-CM | POA: Diagnosis not present

## 2019-01-27 DIAGNOSIS — D225 Melanocytic nevi of trunk: Secondary | ICD-10-CM | POA: Diagnosis not present

## 2019-02-05 DIAGNOSIS — H04123 Dry eye syndrome of bilateral lacrimal glands: Secondary | ICD-10-CM | POA: Diagnosis not present

## 2019-05-06 DIAGNOSIS — H02834 Dermatochalasis of left upper eyelid: Secondary | ICD-10-CM | POA: Diagnosis not present

## 2019-05-06 DIAGNOSIS — H02831 Dermatochalasis of right upper eyelid: Secondary | ICD-10-CM | POA: Diagnosis not present

## 2019-05-06 DIAGNOSIS — H02403 Unspecified ptosis of bilateral eyelids: Secondary | ICD-10-CM | POA: Diagnosis not present

## 2019-05-26 DIAGNOSIS — H02831 Dermatochalasis of right upper eyelid: Secondary | ICD-10-CM | POA: Diagnosis not present

## 2019-05-26 DIAGNOSIS — H02834 Dermatochalasis of left upper eyelid: Secondary | ICD-10-CM | POA: Diagnosis not present

## 2019-05-26 DIAGNOSIS — Z01818 Encounter for other preprocedural examination: Secondary | ICD-10-CM | POA: Diagnosis not present

## 2019-06-01 DIAGNOSIS — H02834 Dermatochalasis of left upper eyelid: Secondary | ICD-10-CM | POA: Diagnosis not present

## 2019-06-01 DIAGNOSIS — H02403 Unspecified ptosis of bilateral eyelids: Secondary | ICD-10-CM | POA: Diagnosis not present

## 2019-06-01 DIAGNOSIS — H02831 Dermatochalasis of right upper eyelid: Secondary | ICD-10-CM | POA: Diagnosis not present

## 2019-06-22 DIAGNOSIS — J309 Allergic rhinitis, unspecified: Secondary | ICD-10-CM | POA: Diagnosis not present

## 2019-06-22 DIAGNOSIS — G4700 Insomnia, unspecified: Secondary | ICD-10-CM | POA: Diagnosis not present

## 2019-06-22 DIAGNOSIS — K219 Gastro-esophageal reflux disease without esophagitis: Secondary | ICD-10-CM | POA: Diagnosis not present

## 2019-06-22 DIAGNOSIS — N528 Other male erectile dysfunction: Secondary | ICD-10-CM | POA: Diagnosis not present

## 2019-06-22 DIAGNOSIS — N400 Enlarged prostate without lower urinary tract symptoms: Secondary | ICD-10-CM | POA: Diagnosis not present

## 2019-06-22 DIAGNOSIS — E1169 Type 2 diabetes mellitus with other specified complication: Secondary | ICD-10-CM | POA: Diagnosis not present

## 2019-06-22 DIAGNOSIS — M109 Gout, unspecified: Secondary | ICD-10-CM | POA: Diagnosis not present

## 2019-06-22 DIAGNOSIS — I10 Essential (primary) hypertension: Secondary | ICD-10-CM | POA: Diagnosis not present

## 2019-06-22 DIAGNOSIS — Z125 Encounter for screening for malignant neoplasm of prostate: Secondary | ICD-10-CM | POA: Diagnosis not present

## 2019-06-22 DIAGNOSIS — M150 Primary generalized (osteo)arthritis: Secondary | ICD-10-CM | POA: Diagnosis not present

## 2019-06-22 DIAGNOSIS — Z0000 Encounter for general adult medical examination without abnormal findings: Secondary | ICD-10-CM | POA: Diagnosis not present

## 2019-06-22 DIAGNOSIS — E782 Mixed hyperlipidemia: Secondary | ICD-10-CM | POA: Diagnosis not present

## 2019-08-11 DIAGNOSIS — X32XXXD Exposure to sunlight, subsequent encounter: Secondary | ICD-10-CM | POA: Diagnosis not present

## 2019-08-11 DIAGNOSIS — L570 Actinic keratosis: Secondary | ICD-10-CM | POA: Diagnosis not present

## 2019-08-11 DIAGNOSIS — Z1283 Encounter for screening for malignant neoplasm of skin: Secondary | ICD-10-CM | POA: Diagnosis not present

## 2019-08-11 DIAGNOSIS — L821 Other seborrheic keratosis: Secondary | ICD-10-CM | POA: Diagnosis not present

## 2019-10-13 DIAGNOSIS — Z08 Encounter for follow-up examination after completed treatment for malignant neoplasm: Secondary | ICD-10-CM | POA: Diagnosis not present

## 2019-10-13 DIAGNOSIS — D2371 Other benign neoplasm of skin of right lower limb, including hip: Secondary | ICD-10-CM | POA: Diagnosis not present

## 2019-10-13 DIAGNOSIS — Z85828 Personal history of other malignant neoplasm of skin: Secondary | ICD-10-CM | POA: Diagnosis not present

## 2019-10-13 DIAGNOSIS — X32XXXD Exposure to sunlight, subsequent encounter: Secondary | ICD-10-CM | POA: Diagnosis not present

## 2019-10-13 DIAGNOSIS — L570 Actinic keratosis: Secondary | ICD-10-CM | POA: Diagnosis not present

## 2020-02-03 DIAGNOSIS — E782 Mixed hyperlipidemia: Secondary | ICD-10-CM | POA: Diagnosis not present

## 2020-02-03 DIAGNOSIS — I10 Essential (primary) hypertension: Secondary | ICD-10-CM | POA: Diagnosis not present

## 2020-02-03 DIAGNOSIS — E1169 Type 2 diabetes mellitus with other specified complication: Secondary | ICD-10-CM | POA: Diagnosis not present

## 2020-04-19 DIAGNOSIS — X32XXXD Exposure to sunlight, subsequent encounter: Secondary | ICD-10-CM | POA: Diagnosis not present

## 2020-04-19 DIAGNOSIS — L820 Inflamed seborrheic keratosis: Secondary | ICD-10-CM | POA: Diagnosis not present

## 2020-04-19 DIAGNOSIS — L570 Actinic keratosis: Secondary | ICD-10-CM | POA: Diagnosis not present

## 2020-06-24 DIAGNOSIS — E782 Mixed hyperlipidemia: Secondary | ICD-10-CM | POA: Diagnosis not present

## 2020-06-24 DIAGNOSIS — G4700 Insomnia, unspecified: Secondary | ICD-10-CM | POA: Diagnosis not present

## 2020-06-24 DIAGNOSIS — Z125 Encounter for screening for malignant neoplasm of prostate: Secondary | ICD-10-CM | POA: Diagnosis not present

## 2020-06-24 DIAGNOSIS — N528 Other male erectile dysfunction: Secondary | ICD-10-CM | POA: Diagnosis not present

## 2020-06-24 DIAGNOSIS — N400 Enlarged prostate without lower urinary tract symptoms: Secondary | ICD-10-CM | POA: Diagnosis not present

## 2020-06-24 DIAGNOSIS — I10 Essential (primary) hypertension: Secondary | ICD-10-CM | POA: Diagnosis not present

## 2020-06-24 DIAGNOSIS — J309 Allergic rhinitis, unspecified: Secondary | ICD-10-CM | POA: Diagnosis not present

## 2020-06-24 DIAGNOSIS — M150 Primary generalized (osteo)arthritis: Secondary | ICD-10-CM | POA: Diagnosis not present

## 2020-06-24 DIAGNOSIS — M109 Gout, unspecified: Secondary | ICD-10-CM | POA: Diagnosis not present

## 2020-06-24 DIAGNOSIS — E1169 Type 2 diabetes mellitus with other specified complication: Secondary | ICD-10-CM | POA: Diagnosis not present

## 2020-06-24 DIAGNOSIS — K219 Gastro-esophageal reflux disease without esophagitis: Secondary | ICD-10-CM | POA: Diagnosis not present

## 2020-06-24 DIAGNOSIS — Z0000 Encounter for general adult medical examination without abnormal findings: Secondary | ICD-10-CM | POA: Diagnosis not present

## 2020-07-01 DIAGNOSIS — Z961 Presence of intraocular lens: Secondary | ICD-10-CM | POA: Diagnosis not present

## 2020-07-01 DIAGNOSIS — H04123 Dry eye syndrome of bilateral lacrimal glands: Secondary | ICD-10-CM | POA: Diagnosis not present

## 2020-07-22 DIAGNOSIS — D225 Melanocytic nevi of trunk: Secondary | ICD-10-CM | POA: Diagnosis not present

## 2020-07-22 DIAGNOSIS — L814 Other melanin hyperpigmentation: Secondary | ICD-10-CM | POA: Diagnosis not present

## 2020-07-22 DIAGNOSIS — Z1283 Encounter for screening for malignant neoplasm of skin: Secondary | ICD-10-CM | POA: Diagnosis not present

## 2020-07-22 DIAGNOSIS — L818 Other specified disorders of pigmentation: Secondary | ICD-10-CM | POA: Diagnosis not present

## 2020-07-22 DIAGNOSIS — X32XXXD Exposure to sunlight, subsequent encounter: Secondary | ICD-10-CM | POA: Diagnosis not present

## 2020-07-22 DIAGNOSIS — L570 Actinic keratosis: Secondary | ICD-10-CM | POA: Diagnosis not present

## 2020-09-02 DIAGNOSIS — L570 Actinic keratosis: Secondary | ICD-10-CM | POA: Diagnosis not present

## 2020-09-02 DIAGNOSIS — X32XXXD Exposure to sunlight, subsequent encounter: Secondary | ICD-10-CM | POA: Diagnosis not present

## 2020-12-28 DIAGNOSIS — L919 Hypertrophic disorder of the skin, unspecified: Secondary | ICD-10-CM | POA: Diagnosis not present

## 2020-12-28 DIAGNOSIS — G4700 Insomnia, unspecified: Secondary | ICD-10-CM | POA: Diagnosis not present

## 2020-12-28 DIAGNOSIS — E1169 Type 2 diabetes mellitus with other specified complication: Secondary | ICD-10-CM | POA: Diagnosis not present

## 2020-12-28 DIAGNOSIS — E782 Mixed hyperlipidemia: Secondary | ICD-10-CM | POA: Diagnosis not present

## 2020-12-28 DIAGNOSIS — I10 Essential (primary) hypertension: Secondary | ICD-10-CM | POA: Diagnosis not present

## 2021-06-06 DIAGNOSIS — L821 Other seborrheic keratosis: Secondary | ICD-10-CM | POA: Diagnosis not present

## 2021-06-06 DIAGNOSIS — Z1283 Encounter for screening for malignant neoplasm of skin: Secondary | ICD-10-CM | POA: Diagnosis not present

## 2021-07-03 DIAGNOSIS — Z961 Presence of intraocular lens: Secondary | ICD-10-CM | POA: Diagnosis not present

## 2021-07-03 DIAGNOSIS — I10 Essential (primary) hypertension: Secondary | ICD-10-CM | POA: Diagnosis not present

## 2021-07-03 DIAGNOSIS — H04123 Dry eye syndrome of bilateral lacrimal glands: Secondary | ICD-10-CM | POA: Diagnosis not present

## 2021-07-03 DIAGNOSIS — H35033 Hypertensive retinopathy, bilateral: Secondary | ICD-10-CM | POA: Diagnosis not present

## 2021-07-11 DIAGNOSIS — G4700 Insomnia, unspecified: Secondary | ICD-10-CM | POA: Diagnosis not present

## 2021-07-11 DIAGNOSIS — E782 Mixed hyperlipidemia: Secondary | ICD-10-CM | POA: Diagnosis not present

## 2021-07-11 DIAGNOSIS — N400 Enlarged prostate without lower urinary tract symptoms: Secondary | ICD-10-CM | POA: Diagnosis not present

## 2021-07-11 DIAGNOSIS — I10 Essential (primary) hypertension: Secondary | ICD-10-CM | POA: Diagnosis not present

## 2021-07-11 DIAGNOSIS — K219 Gastro-esophageal reflux disease without esophagitis: Secondary | ICD-10-CM | POA: Diagnosis not present

## 2021-07-11 DIAGNOSIS — E1169 Type 2 diabetes mellitus with other specified complication: Secondary | ICD-10-CM | POA: Diagnosis not present

## 2021-07-11 DIAGNOSIS — N528 Other male erectile dysfunction: Secondary | ICD-10-CM | POA: Diagnosis not present

## 2021-07-11 DIAGNOSIS — Z0000 Encounter for general adult medical examination without abnormal findings: Secondary | ICD-10-CM | POA: Diagnosis not present

## 2021-07-11 DIAGNOSIS — J309 Allergic rhinitis, unspecified: Secondary | ICD-10-CM | POA: Diagnosis not present

## 2021-07-11 DIAGNOSIS — M109 Gout, unspecified: Secondary | ICD-10-CM | POA: Diagnosis not present

## 2021-07-11 DIAGNOSIS — M150 Primary generalized (osteo)arthritis: Secondary | ICD-10-CM | POA: Diagnosis not present

## 2021-08-16 DIAGNOSIS — K219 Gastro-esophageal reflux disease without esophagitis: Secondary | ICD-10-CM | POA: Diagnosis not present

## 2021-08-16 DIAGNOSIS — E1169 Type 2 diabetes mellitus with other specified complication: Secondary | ICD-10-CM | POA: Diagnosis not present

## 2021-08-16 DIAGNOSIS — E782 Mixed hyperlipidemia: Secondary | ICD-10-CM | POA: Diagnosis not present

## 2021-08-16 DIAGNOSIS — G4700 Insomnia, unspecified: Secondary | ICD-10-CM | POA: Diagnosis not present

## 2021-08-16 DIAGNOSIS — I10 Essential (primary) hypertension: Secondary | ICD-10-CM | POA: Diagnosis not present

## 2021-08-25 DIAGNOSIS — L570 Actinic keratosis: Secondary | ICD-10-CM | POA: Diagnosis not present

## 2021-08-25 DIAGNOSIS — I781 Nevus, non-neoplastic: Secondary | ICD-10-CM | POA: Diagnosis not present

## 2021-08-25 DIAGNOSIS — D1724 Benign lipomatous neoplasm of skin and subcutaneous tissue of left leg: Secondary | ICD-10-CM | POA: Diagnosis not present

## 2021-08-25 DIAGNOSIS — X32XXXD Exposure to sunlight, subsequent encounter: Secondary | ICD-10-CM | POA: Diagnosis not present

## 2021-08-25 DIAGNOSIS — L821 Other seborrheic keratosis: Secondary | ICD-10-CM | POA: Diagnosis not present

## 2021-09-05 DIAGNOSIS — N400 Enlarged prostate without lower urinary tract symptoms: Secondary | ICD-10-CM | POA: Diagnosis not present

## 2021-09-05 DIAGNOSIS — G4700 Insomnia, unspecified: Secondary | ICD-10-CM | POA: Diagnosis not present

## 2021-09-05 DIAGNOSIS — K219 Gastro-esophageal reflux disease without esophagitis: Secondary | ICD-10-CM | POA: Diagnosis not present

## 2021-09-05 DIAGNOSIS — I10 Essential (primary) hypertension: Secondary | ICD-10-CM | POA: Diagnosis not present

## 2021-09-05 DIAGNOSIS — E782 Mixed hyperlipidemia: Secondary | ICD-10-CM | POA: Diagnosis not present

## 2021-09-05 DIAGNOSIS — M150 Primary generalized (osteo)arthritis: Secondary | ICD-10-CM | POA: Diagnosis not present

## 2021-09-19 DIAGNOSIS — I10 Essential (primary) hypertension: Secondary | ICD-10-CM | POA: Diagnosis not present

## 2021-09-21 DIAGNOSIS — G4700 Insomnia, unspecified: Secondary | ICD-10-CM | POA: Diagnosis not present

## 2021-09-21 DIAGNOSIS — E782 Mixed hyperlipidemia: Secondary | ICD-10-CM | POA: Diagnosis not present

## 2021-09-21 DIAGNOSIS — I10 Essential (primary) hypertension: Secondary | ICD-10-CM | POA: Diagnosis not present

## 2021-09-28 DIAGNOSIS — I10 Essential (primary) hypertension: Secondary | ICD-10-CM | POA: Diagnosis not present

## 2021-11-14 DIAGNOSIS — L570 Actinic keratosis: Secondary | ICD-10-CM | POA: Diagnosis not present

## 2021-11-14 DIAGNOSIS — X32XXXD Exposure to sunlight, subsequent encounter: Secondary | ICD-10-CM | POA: Diagnosis not present

## 2021-11-14 DIAGNOSIS — L280 Lichen simplex chronicus: Secondary | ICD-10-CM | POA: Diagnosis not present

## 2021-11-14 DIAGNOSIS — D044 Carcinoma in situ of skin of scalp and neck: Secondary | ICD-10-CM | POA: Diagnosis not present

## 2022-01-09 DIAGNOSIS — Z85828 Personal history of other malignant neoplasm of skin: Secondary | ICD-10-CM | POA: Diagnosis not present

## 2022-01-09 DIAGNOSIS — Z08 Encounter for follow-up examination after completed treatment for malignant neoplasm: Secondary | ICD-10-CM | POA: Diagnosis not present

## 2022-01-09 DIAGNOSIS — X32XXXD Exposure to sunlight, subsequent encounter: Secondary | ICD-10-CM | POA: Diagnosis not present

## 2022-01-09 DIAGNOSIS — L570 Actinic keratosis: Secondary | ICD-10-CM | POA: Diagnosis not present

## 2022-01-15 DIAGNOSIS — E1169 Type 2 diabetes mellitus with other specified complication: Secondary | ICD-10-CM | POA: Diagnosis not present

## 2022-01-15 DIAGNOSIS — I10 Essential (primary) hypertension: Secondary | ICD-10-CM | POA: Diagnosis not present

## 2022-01-15 DIAGNOSIS — E782 Mixed hyperlipidemia: Secondary | ICD-10-CM | POA: Diagnosis not present

## 2022-02-28 DIAGNOSIS — X32XXXD Exposure to sunlight, subsequent encounter: Secondary | ICD-10-CM | POA: Diagnosis not present

## 2022-02-28 DIAGNOSIS — L280 Lichen simplex chronicus: Secondary | ICD-10-CM | POA: Diagnosis not present

## 2022-02-28 DIAGNOSIS — L570 Actinic keratosis: Secondary | ICD-10-CM | POA: Diagnosis not present

## 2022-04-13 ENCOUNTER — Other Ambulatory Visit: Payer: Self-pay

## 2022-04-13 ENCOUNTER — Emergency Department (HOSPITAL_BASED_OUTPATIENT_CLINIC_OR_DEPARTMENT_OTHER)
Admission: EM | Admit: 2022-04-13 | Discharge: 2022-04-13 | Disposition: A | Discharge status: Home / Self Care | Payer: PPO | Attending: Emergency Medicine | Admitting: Emergency Medicine

## 2022-04-13 ENCOUNTER — Emergency Department (HOSPITAL_BASED_OUTPATIENT_CLINIC_OR_DEPARTMENT_OTHER): Payer: PPO

## 2022-04-13 ENCOUNTER — Encounter (HOSPITAL_BASED_OUTPATIENT_CLINIC_OR_DEPARTMENT_OTHER): Payer: Self-pay | Admitting: Emergency Medicine

## 2022-04-13 DIAGNOSIS — R42 Dizziness and giddiness: Secondary | ICD-10-CM | POA: Insufficient documentation

## 2022-04-13 DIAGNOSIS — Z79899 Other long term (current) drug therapy: Secondary | ICD-10-CM | POA: Diagnosis not present

## 2022-04-13 DIAGNOSIS — R55 Syncope and collapse: Secondary | ICD-10-CM | POA: Diagnosis not present

## 2022-04-13 DIAGNOSIS — Z7982 Long term (current) use of aspirin: Secondary | ICD-10-CM | POA: Diagnosis not present

## 2022-04-13 DIAGNOSIS — I10 Essential (primary) hypertension: Secondary | ICD-10-CM | POA: Diagnosis not present

## 2022-04-13 DIAGNOSIS — R9431 Abnormal electrocardiogram [ECG] [EKG]: Secondary | ICD-10-CM | POA: Diagnosis not present

## 2022-04-13 DIAGNOSIS — R030 Elevated blood-pressure reading, without diagnosis of hypertension: Secondary | ICD-10-CM | POA: Diagnosis not present

## 2022-04-13 DIAGNOSIS — I6523 Occlusion and stenosis of bilateral carotid arteries: Secondary | ICD-10-CM | POA: Diagnosis not present

## 2022-04-13 LAB — COMPREHENSIVE METABOLIC PANEL
ALT: 15 U/L (ref 0–44)
AST: 19 U/L (ref 15–41)
Albumin: 4.8 g/dL (ref 3.5–5.0)
Alkaline Phosphatase: 110 U/L (ref 38–126)
Anion gap: 8 (ref 5–15)
BUN: 16 mg/dL (ref 8–23)
CO2: 26 mmol/L (ref 22–32)
Calcium: 9.9 mg/dL (ref 8.9–10.3)
Chloride: 96 mmol/L — ABNORMAL LOW (ref 98–111)
Creatinine, Ser: 0.86 mg/dL (ref 0.61–1.24)
GFR, Estimated: >60 mL/min (ref >60)
Glucose, Bld: 117 mg/dL — ABNORMAL HIGH (ref 70–99)
Potassium: 4.3 mmol/L (ref 3.5–5.1)
Sodium: 130 mmol/L — ABNORMAL LOW (ref 135–145)
Total Bilirubin: 0.7 mg/dL (ref 0.3–1.2)
Total Protein: 8 g/dL (ref 6.5–8.1)

## 2022-04-13 LAB — CBC WITH DIFFERENTIAL/PLATELET
Abs Immature Granulocytes: 0.03 10*3/uL (ref 0.00–0.07)
Basophils Absolute: 0.1 10*3/uL (ref 0.0–0.1)
Basophils Relative: 1 %
Eosinophils Absolute: 0 10*3/uL (ref 0.0–0.5)
Eosinophils Relative: 0 %
HCT: 38.8 % — ABNORMAL LOW (ref 39.0–52.0)
Hemoglobin: 13.2 g/dL (ref 13.0–17.0)
Immature Granulocytes: 0 %
Lymphocytes Relative: 20 %
Lymphs Abs: 1.9 10*3/uL (ref 0.7–4.0)
MCH: 30.5 pg (ref 26.0–34.0)
MCHC: 34 g/dL (ref 30.0–36.0)
MCV: 89.6 fL (ref 80.0–100.0)
Monocytes Absolute: 0.7 10*3/uL (ref 0.1–1.0)
Monocytes Relative: 7 %
Neutro Abs: 7.1 10*3/uL (ref 1.7–7.7)
Neutrophils Relative %: 72 %
Platelets: 228 10*3/uL (ref 150–400)
RBC: 4.33 MIL/uL (ref 4.22–5.81)
RDW: 12.7 % (ref 11.5–15.5)
WBC: 9.9 10*3/uL (ref 4.0–10.5)
nRBC: 0 % (ref 0.0–0.2)

## 2022-04-13 LAB — MAGNESIUM: Magnesium: 1.9 mg/dL (ref 1.7–2.4)

## 2022-04-13 LAB — TSH: TSH: 0.761 u[IU]/mL (ref 0.350–4.500)

## 2022-04-13 MED ORDER — IOHEXOL 350 MG/ML SOLN
100.0000 mL | Freq: Once | INTRAVENOUS | Status: AC | PRN
  Administered 2022-04-13: 100 mL via INTRAVENOUS

## 2022-04-13 MED ORDER — MECLIZINE HCL 25 MG PO TABS: 25.0000 mg | ORAL_TABLET | Freq: Three times a day (TID) | ORAL | 0 refills | Status: AC | PRN

## 2022-04-13 NOTE — Discharge Instructions (Signed)
Your history, exam, workup today were overall reassuring.  The MRI did not show evidence of acute stroke, mass, or other critical finding.  The CT imaging of the head and neck did not show evidence of critical stenosis or blockages.  I suspect the dizzy symptoms and vision moving symptoms were related to vertigo this morning.  Please fill the prescription for a medication that can help with this and have you follow-up with your primary doctor.  Please rest and stay hydrated.  If any symptoms change or worsen acutely, please return to the nearest emergency department.

## 2022-04-13 NOTE — ED Notes (Signed)
Reviewed AVS with patient, patient expressed understanding of directions, denies further questions at this time.

## 2022-04-13 NOTE — ED Triage Notes (Signed)
Pt arrives to ED from [facility] walk-in clinic with c/o abnormal EKG. Pt reports that he woke up this morning and had an acute episode of dizziness which made him go to the clinic.  [facility] sent him to ED for EKG showing First degree heart block.

## 2022-04-13 NOTE — ED Notes (Addendum)
Report received from [place], [place]. Patient resting comfortably in stretcher, respirations even, unlabored, no acute distress noted. Patient's daughter at bedside. Patient denies any symptoms, states he is just eager to leave ED. Patient aware we are awaiting radiologist read of CT scan for disposition.

## 2022-04-13 NOTE — ED Provider Notes (Signed)
[facility] Provider Note   CSN: [id number] Arrival date & time: 04/13/22  1453     History  Chief Complaint  Patient presents with   Abnormal ECG    Trevor Ortiz is a 82 y.o. male.  The history is provided by the patient and medical records. No language interpreter was used.  Dizziness Quality:  Vertigo, room spinning, head spinning and lightheadedness Severity:  Severe Onset quality:  Sudden Duration:  7 hours Timing:  Intermittent Progression:  Resolved Chronicity:  New Context: head movement and standing up   Relieved by:  Nothing Worsened by:  Nothing Associated symptoms: vision changes   Associated symptoms: no chest pain, no diarrhea, no headaches, no nausea, no palpitations, no shortness of breath, no syncope, no vomiting and no weakness        Home Medications Prior to Admission medications   Medication Sig Start Date End Date Taking? Authorizing Provider  allopurinol (ZYLOPRIM) 100 MG tablet Take 100 mg by mouth daily as needed (FOR GOUT).     [provider]  aspirin 81 MG tablet Take 81 mg by mouth daily.    [provider]  colchicine 0.6 MG tablet Take 0.6 mg by mouth daily as needed (FOR GOUT).     [provider]  diltiazem (TIAZAC) 360 MG 24 hr capsule Take 360 mg by mouth at bedtime.    [provider]  omeprazole (PRILOSEC) 20 MG capsule Take 20 mg by mouth daily.    [provider]  ramipril (ALTACE) 10 MG capsule Take 20 mg by mouth at bedtime.     [provider]  rosuvastatin (CRESTOR) 20 MG tablet Take 20 mg by mouth daily.    [provider]      Allergies    Novocain [procaine]    Review of Systems   Review of Systems  Constitutional:  Negative for chills, fatigue and fever.  HENT:  Negative for congestion.   Eyes:  Positive for visual disturbance.  Respiratory:  Negative for cough, chest tightness, shortness of breath and  wheezing.   Cardiovascular:  Negative for chest pain, palpitations and syncope.  Gastrointestinal:  Negative for abdominal pain, constipation, diarrhea, nausea and vomiting.  Genitourinary:  Negative for dysuria and flank pain.  Musculoskeletal:  Negative for back pain, neck pain and neck stiffness.  Skin:  Negative for rash and wound.  Neurological:  Positive for dizziness. Negative for syncope, speech difficulty, weakness, light-headedness, numbness and headaches.  Psychiatric/Behavioral:  Negative for agitation.   All other systems reviewed and are negative.   Physical Exam Updated Vital Signs BP (!) 172/94 (BP Location: Left Arm)   Pulse (!) 59   Temp 97.8 F (36.6 C) (Oral)   Resp 16   Ht '5\' 6"'$  (1.676 m)   Wt 81.6 kg   SpO2 100%   BMI 29.05 kg/m  Physical Exam Vitals and nursing note reviewed.  Constitutional:      General: He is not in acute distress.    Appearance: He is well-developed. He is not ill-appearing, toxic-appearing or diaphoretic.  HENT:     Head: Normocephalic and atraumatic.     Nose: Nose normal. No congestion or rhinorrhea.     Mouth/Throat:     Mouth: Mucous membranes are moist.     Pharynx: No oropharyngeal exudate or posterior oropharyngeal erythema.  Eyes:     Extraocular Movements: Extraocular movements intact.     Conjunctiva/sclera: Conjunctivae normal.  Pupils: Pupils are equal, round, and reactive to light.  Neck:     Vascular: No carotid bruit.  Cardiovascular:     Rate and Rhythm: Normal rate and regular rhythm.     Heart sounds: No murmur heard. Pulmonary:     Effort: Pulmonary effort is normal. No respiratory distress.     Breath sounds: Normal breath sounds. No wheezing, rhonchi or rales.  Chest:     Chest wall: No tenderness.  Abdominal:     Palpations: Abdomen is soft.     Tenderness: There is no abdominal tenderness. There is no right CVA tenderness, left CVA tenderness, guarding or rebound.  Musculoskeletal:         General: No swelling or tenderness.     Cervical back: Neck supple. No tenderness.     Right lower leg: No edema.     Left lower leg: No edema.  Skin:    General: Skin is warm and dry.     Capillary Refill: Capillary refill takes less than 2 seconds.     Coloration: Skin is not pale.     Findings: No erythema or rash.  Neurological:     General: No focal deficit present.     Mental Status: He is alert.     Sensory: No sensory deficit.     Motor: No weakness.  Psychiatric:        Mood and Affect: Mood normal.     ED Results / Procedures / Treatments   Labs (all labs ordered are listed, but only abnormal results are displayed) Labs Reviewed  CBC WITH DIFFERENTIAL/PLATELET - Abnormal; Notable for the following components:      Result Value   HCT 38.8 (*)    All other components within normal limits  COMPREHENSIVE METABOLIC PANEL - Abnormal; Notable for the following components:   Sodium 130 (*)    Chloride 96 (*)    Glucose, Bld 117 (*)    All other components within normal limits  TSH  MAGNESIUM    EKG EKG Interpretation  Date/Time:  Friday April 13 2022 15:07:10 EST Ventricular Rate:  58 PR Interval:  228 QRS Duration: 97 QT Interval:  415 QTC Calculation: 408 R Axis:   -27 Text Interpretation: Sinus rhythm Prolonged PR interval Borderline left axis deviation Abnormal R-wave progression, late transition Borderline T wave abnormalities  when compared to  ECG before today, slightly longer PR interval. No STEMI Confirmed by Antony Blackbird [phone] on 04/13/2022 3:45:05 PM  Radiology CT ANGIO HEAD NECK W WO CM  Result Date: 04/13/2022 CLINICAL DATA:  Syncope EXAM: CT ANGIOGRAPHY HEAD AND NECK TECHNIQUE: Multidetector CT imaging of the head and neck was performed using the standard protocol during bolus administration of intravenous contrast. Multiplanar CT image reconstructions and MIPs were obtained to evaluate the vascular anatomy. Carotid stenosis measurements  (when applicable) are obtained utilizing NASCET criteria, using the distal internal carotid diameter as the denominator. RADIATION DOSE REDUCTION: This exam was performed according to the departmental dose-optimization program which includes automated exposure control, adjustment of the mA and/or kV according to patient size and/or use of iterative reconstruction technique. CONTRAST:  158m OMNIPAQUE IOHEXOL 350 MG/ML SOLN COMPARISON:  None Available. FINDINGS: CT HEAD FINDINGS Brain: There is no mass, hemorrhage or extra-axial collection. The size and configuration of the ventricles and extra-axial CSF spaces are normal. There is no acute or chronic infarction. The brain parenchyma is normal. Skull: The visualized skull base, calvarium and extracranial soft tissues are normal. Sinuses/Orbits:  No fluid levels or advanced mucosal thickening of the visualized paranasal sinuses. No mastoid or middle ear effusion. The orbits are normal. CTA NECK FINDINGS SKELETON: There is no bony spinal canal stenosis. No lytic or blastic lesion. OTHER NECK: Normal pharynx, larynx and major salivary glands. No cervical lymphadenopathy. Unremarkable thyroid gland. UPPER CHEST: No pneumothorax or pleural effusion. No nodules or masses. AORTIC ARCH: There is calcific atherosclerosis of the aortic arch. There is no aneurysm, dissection or hemodynamically significant stenosis of the visualized portion of the aorta. Conventional 3 vessel aortic branching pattern. The visualized proximal subclavian arteries are widely patent. RIGHT CAROTID SYSTEM: No dissection, occlusion or aneurysm. Mild atherosclerotic calcification at the carotid bifurcation without hemodynamically significant stenosis. LEFT CAROTID SYSTEM: No dissection, occlusion or aneurysm. Mild atherosclerotic calcification at the carotid bifurcation without hemodynamically significant stenosis. VERTEBRAL ARTERIES: Left dominant configuration. Both origins are clearly patent. There  is no dissection, occlusion or flow-limiting stenosis to the skull base (V1-V3 segments). CTA HEAD FINDINGS POSTERIOR CIRCULATION: --Vertebral arteries: Normal V4 segments. --Inferior cerebellar arteries: Normal. --Basilar artery: Normal. --Superior cerebellar arteries: Normal. --Posterior cerebral arteries (PCA): Normal. ANTERIOR CIRCULATION: --Intracranial internal carotid arteries: Atherosclerotic calcification of the internal carotid arteries at the skull base without hemodynamically significant stenosis. --Anterior cerebral arteries (ACA): Normal. Both A1 segments are present. Patent anterior communicating artery (a-comm). --Middle cerebral arteries (MCA): Normal. VENOUS SINUSES: As permitted by contrast timing, patent. ANATOMIC VARIANTS: None Review of the MIP images confirms the above findings. IMPRESSION: 1. No emergent large vessel occlusion or hemodynamically significant stenosis of the head or neck. 2. Mild bilateral carotid bifurcation atherosclerosis without hemodynamically significant stenosis. Aortic atherosclerosis (ICD10-I70.0). Electronically Signed   By: Ulyses Jarred M.D.   On: 04/13/2022 19:08   MR BRAIN WO CONTRAST  Result Date: 04/13/2022 CLINICAL DATA:  Syncope/presyncope, cerebrovascular cause suspected 7 hours of room spinning dizziness. no hx of vertigo EXAM: MRI HEAD WITHOUT CONTRAST TECHNIQUE: Multiplanar, multiecho pulse sequences of the brain and surrounding structures were obtained without intravenous contrast. COMPARISON:  None Available. FINDINGS: Brain: No acute infarction, hemorrhage, hydrocephalus, extra-axial collection or mass lesion. Vascular: Major arterial flow voids are maintained at the skull base. Skull and upper cervical spine: Normal marrow signal. Sinuses/Orbits: Clear sinuses.  No acute orbital findings. Other: No significant mastoid effusions. IMPRESSION: Unremarkable brain MRI for patient age.  No acute abnormality. Electronically Signed   By: Margaretha Sheffield  M.D.   On: 04/13/2022 17:27    Procedures Procedures    Medications Ordered in ED Medications  iohexol (OMNIPAQUE) 350 MG/ML injection 100 mL (100 mLs Intravenous Contrast Given 04/13/22 1811)    ED Course/ Medical Decision Making/ A&P                             Medical Decision Making Amount and/or Complexity of Data Reviewed Labs: ordered. Radiology: ordered.  Risk Prescription drug management.    Trevor Ortiz is a 82 y.o. male with a past medical history significant for hypertension, hyperlipidemia and gout who presents with 7 hours of intermittent dizziness and vision changes today.  According to patient, he was at his baseline and feeling normal him to bed last night.  He did have 2 bloody Mary's he reports as well as some salty foods.  He reports his blood pressure is slightly more elevated today than normal in the 130s to 150s and is now in the [id number] systolic.  He went to a walk-in clinic  today talking about having dizziness this morning and they sent him here after their initial evaluation.  He says that at 3 AM he woke up with a room spinning dizzy sensation that he is never had before.  He has not a history of vertigo to his knowledge.  No recent medication changes or other preceding symptoms.  He specifically denied headache, neck pain, neck stiffness, nausea, vomiting, constipation, diarrhea, or urinary changes.  Denies any palpitations chest pain or shortness of breath during the episodes.  He said that it was provoked when he moved his head up or when he turned his head.  He denies any arm or leg symptoms speech difficulties or other complaints.  He did say that when he was trying to look at his clock the illuminated number was moving to the side without him meaning to do so.  On my exam, lungs were clear.  Chest nontender.  Abdomen nontender.  No focal neurologic deficits initially with intact sensation, strength, and pulse in all extremities.  Normal finger-nose-finger  testing.  Symmetric smile.  Clear speech.  Pupils are symmetric and reactive with normal extraocular movements.  No carotid bruits on my exam.  Patient well-appearing otherwise.  Clinically I suspect patient has an episode of vertigo that may have been provoked by some elevated blood pressures, dehydration, or the increase in alcohol he had last night before bed.  However, given his age, comorbidities, and 7 hours of waxing waning symptoms for about 3 AM until 10 AM, I do feel is reasonable to rule out a cerebrovascular etiology such as a posterior vascular issue or even a TIA or stroke.  We offered more conservative management with outpatient follow-up but patient would prefer to get some imaging today.  Will do CT of the head and neck as well as an MRI brain which we have available today.  Also get some screening blood work.  If workup is reassuring, dissipate discharge home with prescription for meclizine and PCP follow-up.  MRI and CT imaging returned overall reassuring.  Critical stenosis or LVO seen on CT imaging and MRI showed no acute stroke or mass or bleed.  His magnesium was normal, TSH normal, and CBC reassuring.  Metabolic panel showed slight decrease in sodium and chloride which may be related to some dehydration.  Patient overall well-appearing and we feel he is now safe for discharge home.  He has had no symptoms since 10 AM this morning.  Will have him follow-up with his PCP and will print a prescription for meclizine for suspected vertigo.  Patient and family agree with plan of care and patient discharged in good condition.         Final Clinical Impression(s) / ED Diagnoses Final diagnoses:  Episode of dizziness    Rx / DC Orders ED Discharge Orders          Ordered    meclizine (ANTIVERT) 25 MG tablet  3 times daily PRN        04/13/22 2038            Clinical Impression: 1. Episode of dizziness     Disposition: Discharge  Condition: Good  I have  discussed the results, Dx and Tx plan with the pt(& family if present). He/she/they expressed understanding and agree(s) with the plan. Discharge instructions discussed at great length. Strict return precautions discussed and pt &/or family have verbalized understanding of the instructions. No further questions at time of discharge.    New Prescriptions  MECLIZINE (ANTIVERT) 25 MG TABLET    Take 1 tablet (25 mg total) by mouth 3 (three) times daily as needed for dizziness.    Follow Up: Mayra Neer, MD [address] [place] [address] [phone]     [facility] at [facility] [phone]       Sheena Simonis, Gwenyth Allegra, MD 04/14/22 0000

## 2022-04-18 DIAGNOSIS — G4700 Insomnia, unspecified: Secondary | ICD-10-CM | POA: Diagnosis not present

## 2022-04-18 DIAGNOSIS — E1169 Type 2 diabetes mellitus with other specified complication: Secondary | ICD-10-CM | POA: Diagnosis not present

## 2022-04-18 DIAGNOSIS — I10 Essential (primary) hypertension: Secondary | ICD-10-CM | POA: Diagnosis not present

## 2022-04-18 DIAGNOSIS — K219 Gastro-esophageal reflux disease without esophagitis: Secondary | ICD-10-CM | POA: Diagnosis not present

## 2022-04-18 DIAGNOSIS — E782 Mixed hyperlipidemia: Secondary | ICD-10-CM | POA: Diagnosis not present

## 2022-04-26 DIAGNOSIS — I10 Essential (primary) hypertension: Secondary | ICD-10-CM | POA: Diagnosis not present

## 2022-07-23 DIAGNOSIS — E119 Type 2 diabetes mellitus without complications: Secondary | ICD-10-CM | POA: Diagnosis not present

## 2022-07-23 DIAGNOSIS — N528 Other male erectile dysfunction: Secondary | ICD-10-CM | POA: Diagnosis not present

## 2022-07-23 DIAGNOSIS — E1169 Type 2 diabetes mellitus with other specified complication: Secondary | ICD-10-CM | POA: Diagnosis not present

## 2022-07-23 DIAGNOSIS — M109 Gout, unspecified: Secondary | ICD-10-CM | POA: Diagnosis not present

## 2022-07-23 DIAGNOSIS — G4700 Insomnia, unspecified: Secondary | ICD-10-CM | POA: Diagnosis not present

## 2022-07-23 DIAGNOSIS — Z0000 Encounter for general adult medical examination without abnormal findings: Secondary | ICD-10-CM | POA: Diagnosis not present

## 2022-07-23 DIAGNOSIS — M150 Primary generalized (osteo)arthritis: Secondary | ICD-10-CM | POA: Diagnosis not present

## 2022-07-23 DIAGNOSIS — N400 Enlarged prostate without lower urinary tract symptoms: Secondary | ICD-10-CM | POA: Diagnosis not present

## 2022-07-23 DIAGNOSIS — I10 Essential (primary) hypertension: Secondary | ICD-10-CM | POA: Diagnosis not present

## 2022-07-23 DIAGNOSIS — I700 Atherosclerosis of aorta: Secondary | ICD-10-CM | POA: Diagnosis not present

## 2022-07-23 DIAGNOSIS — J309 Allergic rhinitis, unspecified: Secondary | ICD-10-CM | POA: Diagnosis not present

## 2022-07-23 DIAGNOSIS — K219 Gastro-esophageal reflux disease without esophagitis: Secondary | ICD-10-CM | POA: Diagnosis not present

## 2022-07-23 DIAGNOSIS — E782 Mixed hyperlipidemia: Secondary | ICD-10-CM | POA: Diagnosis not present

## 2022-08-29 DIAGNOSIS — D485 Neoplasm of uncertain behavior of skin: Secondary | ICD-10-CM | POA: Diagnosis not present

## 2022-08-29 DIAGNOSIS — D225 Melanocytic nevi of trunk: Secondary | ICD-10-CM | POA: Diagnosis not present

## 2022-08-29 DIAGNOSIS — L089 Local infection of the skin and subcutaneous tissue, unspecified: Secondary | ICD-10-CM | POA: Diagnosis not present

## 2022-08-29 DIAGNOSIS — X32XXXD Exposure to sunlight, subsequent encounter: Secondary | ICD-10-CM | POA: Diagnosis not present

## 2022-08-29 DIAGNOSIS — L570 Actinic keratosis: Secondary | ICD-10-CM | POA: Diagnosis not present

## 2022-08-29 DIAGNOSIS — Z1283 Encounter for screening for malignant neoplasm of skin: Secondary | ICD-10-CM | POA: Diagnosis not present

## 2022-08-29 DIAGNOSIS — B351 Tinea unguium: Secondary | ICD-10-CM | POA: Diagnosis not present

## 2023-01-28 DIAGNOSIS — E119 Type 2 diabetes mellitus without complications: Secondary | ICD-10-CM | POA: Diagnosis not present

## 2023-01-28 DIAGNOSIS — E782 Mixed hyperlipidemia: Secondary | ICD-10-CM | POA: Diagnosis not present

## 2023-01-28 DIAGNOSIS — I10 Essential (primary) hypertension: Secondary | ICD-10-CM | POA: Diagnosis not present

## 2023-01-28 DIAGNOSIS — E1169 Type 2 diabetes mellitus with other specified complication: Secondary | ICD-10-CM | POA: Diagnosis not present

## 2023-01-28 DIAGNOSIS — Z125 Encounter for screening for malignant neoplasm of prostate: Secondary | ICD-10-CM | POA: Diagnosis not present

## 2023-07-20 DIAGNOSIS — M150 Primary generalized (osteo)arthritis: Secondary | ICD-10-CM | POA: Diagnosis not present

## 2023-07-20 DIAGNOSIS — E1169 Type 2 diabetes mellitus with other specified complication: Secondary | ICD-10-CM | POA: Diagnosis not present

## 2023-07-20 DIAGNOSIS — N400 Enlarged prostate without lower urinary tract symptoms: Secondary | ICD-10-CM | POA: Diagnosis not present

## 2023-07-20 DIAGNOSIS — E782 Mixed hyperlipidemia: Secondary | ICD-10-CM | POA: Diagnosis not present

## 2023-08-05 DIAGNOSIS — G4700 Insomnia, unspecified: Secondary | ICD-10-CM | POA: Diagnosis not present

## 2023-08-05 DIAGNOSIS — M109 Gout, unspecified: Secondary | ICD-10-CM | POA: Diagnosis not present

## 2023-08-05 DIAGNOSIS — E782 Mixed hyperlipidemia: Secondary | ICD-10-CM | POA: Diagnosis not present

## 2023-08-05 DIAGNOSIS — M150 Primary generalized (osteo)arthritis: Secondary | ICD-10-CM | POA: Diagnosis not present

## 2023-08-05 DIAGNOSIS — E1169 Type 2 diabetes mellitus with other specified complication: Secondary | ICD-10-CM | POA: Diagnosis not present

## 2023-08-05 DIAGNOSIS — I10 Essential (primary) hypertension: Secondary | ICD-10-CM | POA: Diagnosis not present

## 2023-08-05 DIAGNOSIS — K219 Gastro-esophageal reflux disease without esophagitis: Secondary | ICD-10-CM | POA: Diagnosis not present

## 2023-08-05 DIAGNOSIS — N400 Enlarged prostate without lower urinary tract symptoms: Secondary | ICD-10-CM | POA: Diagnosis not present

## 2023-08-05 DIAGNOSIS — Z0000 Encounter for general adult medical examination without abnormal findings: Secondary | ICD-10-CM | POA: Diagnosis not present

## 2023-08-05 DIAGNOSIS — Z1331 Encounter for screening for depression: Secondary | ICD-10-CM | POA: Diagnosis not present

## 2023-08-19 DIAGNOSIS — M150 Primary generalized (osteo)arthritis: Secondary | ICD-10-CM | POA: Diagnosis not present

## 2023-08-19 DIAGNOSIS — E1169 Type 2 diabetes mellitus with other specified complication: Secondary | ICD-10-CM | POA: Diagnosis not present

## 2023-08-19 DIAGNOSIS — N400 Enlarged prostate without lower urinary tract symptoms: Secondary | ICD-10-CM | POA: Diagnosis not present

## 2023-08-19 DIAGNOSIS — E782 Mixed hyperlipidemia: Secondary | ICD-10-CM | POA: Diagnosis not present

## 2023-09-19 DIAGNOSIS — N400 Enlarged prostate without lower urinary tract symptoms: Secondary | ICD-10-CM | POA: Diagnosis not present

## 2023-09-19 DIAGNOSIS — E1169 Type 2 diabetes mellitus with other specified complication: Secondary | ICD-10-CM | POA: Diagnosis not present

## 2023-09-19 DIAGNOSIS — E782 Mixed hyperlipidemia: Secondary | ICD-10-CM | POA: Diagnosis not present

## 2023-09-19 DIAGNOSIS — M150 Primary generalized (osteo)arthritis: Secondary | ICD-10-CM | POA: Diagnosis not present

## 2023-10-08 DIAGNOSIS — Z1283 Encounter for screening for malignant neoplasm of skin: Secondary | ICD-10-CM | POA: Diagnosis not present

## 2023-10-08 DIAGNOSIS — D225 Melanocytic nevi of trunk: Secondary | ICD-10-CM | POA: Diagnosis not present

## 2023-10-20 DIAGNOSIS — N400 Enlarged prostate without lower urinary tract symptoms: Secondary | ICD-10-CM | POA: Diagnosis not present

## 2023-10-20 DIAGNOSIS — E782 Mixed hyperlipidemia: Secondary | ICD-10-CM | POA: Diagnosis not present

## 2023-10-20 DIAGNOSIS — E1169 Type 2 diabetes mellitus with other specified complication: Secondary | ICD-10-CM | POA: Diagnosis not present

## 2023-10-20 DIAGNOSIS — M150 Primary generalized (osteo)arthritis: Secondary | ICD-10-CM | POA: Diagnosis not present

## 2023-11-19 DIAGNOSIS — N400 Enlarged prostate without lower urinary tract symptoms: Secondary | ICD-10-CM | POA: Diagnosis not present

## 2023-11-19 DIAGNOSIS — E782 Mixed hyperlipidemia: Secondary | ICD-10-CM | POA: Diagnosis not present

## 2023-11-19 DIAGNOSIS — E1169 Type 2 diabetes mellitus with other specified complication: Secondary | ICD-10-CM | POA: Diagnosis not present

## 2023-11-19 DIAGNOSIS — M150 Primary generalized (osteo)arthritis: Secondary | ICD-10-CM | POA: Diagnosis not present

## 2023-12-20 DIAGNOSIS — M150 Primary generalized (osteo)arthritis: Secondary | ICD-10-CM | POA: Diagnosis not present

## 2023-12-20 DIAGNOSIS — E782 Mixed hyperlipidemia: Secondary | ICD-10-CM | POA: Diagnosis not present

## 2023-12-20 DIAGNOSIS — E1169 Type 2 diabetes mellitus with other specified complication: Secondary | ICD-10-CM | POA: Diagnosis not present

## 2023-12-20 DIAGNOSIS — N400 Enlarged prostate without lower urinary tract symptoms: Secondary | ICD-10-CM | POA: Diagnosis not present

## 2024-01-19 DIAGNOSIS — E782 Mixed hyperlipidemia: Secondary | ICD-10-CM | POA: Diagnosis not present

## 2024-01-19 DIAGNOSIS — N400 Enlarged prostate without lower urinary tract symptoms: Secondary | ICD-10-CM | POA: Diagnosis not present

## 2024-01-19 DIAGNOSIS — M150 Primary generalized (osteo)arthritis: Secondary | ICD-10-CM | POA: Diagnosis not present

## 2024-01-19 DIAGNOSIS — E1169 Type 2 diabetes mellitus with other specified complication: Secondary | ICD-10-CM | POA: Diagnosis not present

## 2024-01-24 NOTE — Progress Notes (Signed)
 Trevor Ortiz                                          MRN: [id number]   01/24/2024   The [facility] Quality Team Specialist reviewed this patient medical record for the purposes of chart review for care gap closure. The following were reviewed: chart review for care gap closure-controlling blood pressure.    [facility] Quality Team
